# Patient Record
Sex: Female | Born: 1960 | Race: Asian | Hispanic: No | Smoking: Former smoker
Health system: Southern US, Community
[De-identification: ages and names within clinical notes are randomized; demographics above are authoritative.]

## PROBLEM LIST (undated history)

## (undated) DIAGNOSIS — K449 Diaphragmatic hernia without obstruction or gangrene: Secondary | ICD-10-CM

## (undated) DIAGNOSIS — U071 COVID-19: Secondary | ICD-10-CM

## (undated) DIAGNOSIS — D219 Benign neoplasm of connective and other soft tissue, unspecified: Secondary | ICD-10-CM

## (undated) DIAGNOSIS — S82899A Other fracture of unspecified lower leg, initial encounter for closed fracture: Secondary | ICD-10-CM

## (undated) DIAGNOSIS — S3992XA Unspecified injury of lower back, initial encounter: Secondary | ICD-10-CM

## (undated) HISTORY — DX: Diaphragmatic hernia without obstruction or gangrene: K44.9

## (undated) HISTORY — DX: Benign neoplasm of connective and other soft tissue, unspecified: D21.9

## (undated) HISTORY — PX: VAGINAL HYSTERECTOMY: SUR661

## (undated) HISTORY — DX: Other fracture of unspecified lower leg, initial encounter for closed fracture: S82.899A

## (undated) HISTORY — DX: Unspecified injury of lower back, initial encounter: S39.92XA

## (undated) HISTORY — DX: COVID-19: U07.1

---

## 1998-08-08 ENCOUNTER — Emergency Department (HOSPITAL_COMMUNITY): Admission: EM | Admit: 1998-08-08 | Discharge: 1998-08-08 | Payer: Self-pay | Admitting: Emergency Medicine

## 1999-08-25 ENCOUNTER — Emergency Department (HOSPITAL_COMMUNITY): Admission: EM | Admit: 1999-08-25 | Discharge: 1999-08-25 | Payer: Self-pay | Admitting: Emergency Medicine

## 2000-07-01 ENCOUNTER — Other Ambulatory Visit: Admission: RE | Admit: 2000-07-01 | Discharge: 2000-07-01 | Payer: Self-pay | Admitting: Gynecology

## 2002-07-02 ENCOUNTER — Emergency Department (HOSPITAL_COMMUNITY): Admission: EM | Admit: 2002-07-02 | Discharge: 2002-07-02 | Payer: Self-pay | Admitting: Emergency Medicine

## 2002-07-04 ENCOUNTER — Emergency Department (HOSPITAL_COMMUNITY): Admission: EM | Admit: 2002-07-04 | Discharge: 2002-07-04 | Payer: Self-pay | Admitting: Emergency Medicine

## 2002-10-08 ENCOUNTER — Other Ambulatory Visit: Admission: RE | Admit: 2002-10-08 | Discharge: 2002-10-08 | Payer: Self-pay | Admitting: Gynecology

## 2003-10-27 ENCOUNTER — Other Ambulatory Visit: Admission: RE | Admit: 2003-10-27 | Discharge: 2003-10-27 | Payer: Self-pay | Admitting: Gynecology

## 2004-05-04 ENCOUNTER — Encounter: Admission: RE | Admit: 2004-05-04 | Discharge: 2004-05-04 | Payer: Self-pay | Admitting: Neurology

## 2004-10-29 ENCOUNTER — Other Ambulatory Visit: Admission: RE | Admit: 2004-10-29 | Discharge: 2004-10-29 | Payer: Self-pay | Admitting: Gynecology

## 2005-10-30 ENCOUNTER — Other Ambulatory Visit: Admission: RE | Admit: 2005-10-30 | Discharge: 2005-10-30 | Payer: Self-pay | Admitting: Gynecology

## 2005-12-03 IMAGING — CR DG CERVICAL SPINE 2 OR 3 VIEWS
2 series · 2 of 2 positions shown · non-contrast
Comparison: none

CLINICAL DATA: Neck pain.  
 AP AND LATERAL CERVICAL SPINE: 
 There is slight reversal of the normal cervical lordosis at C4-5.  There is some minimal posterior spurring at C4-5 and C5-6 and on the AP view there is uncinate spurring at C5-6 on the left.  Intervertebral disc spaces are well preserved.  No facet joint arthritis.

[view not recorded (1 of 2)]
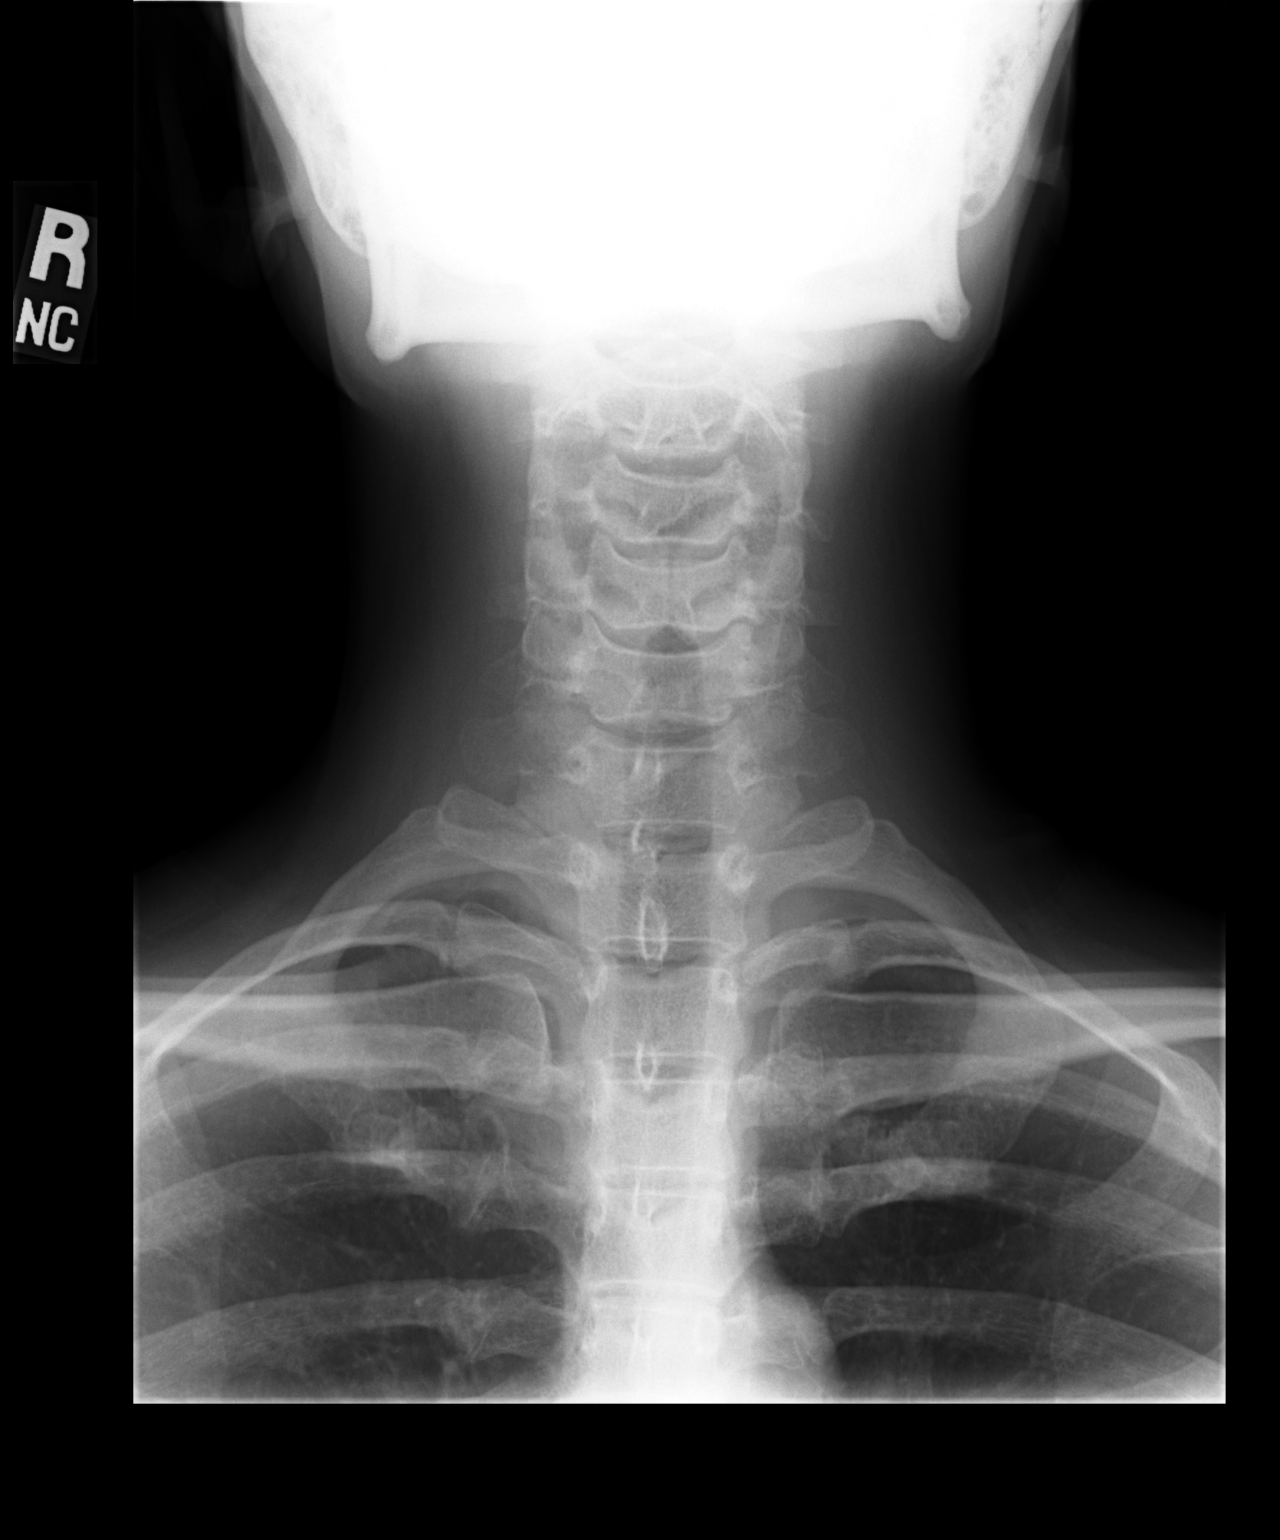

[view not recorded (2 of 2)]
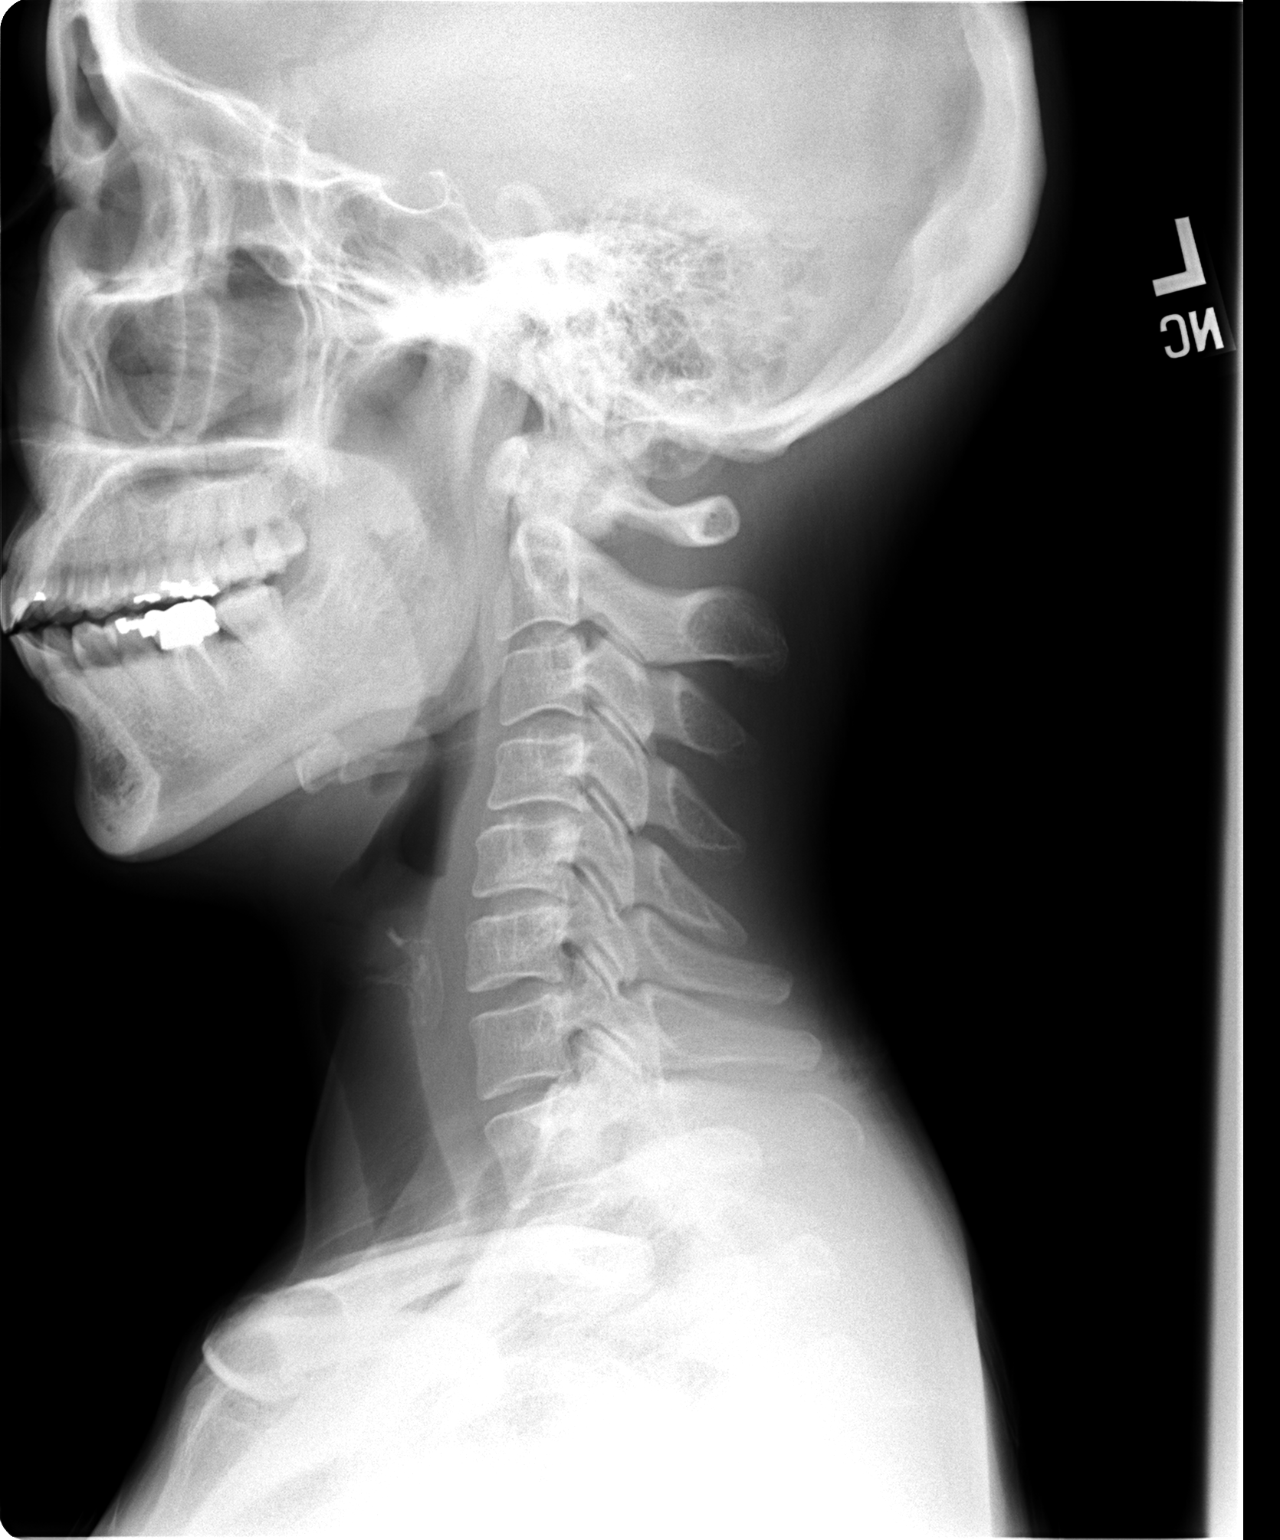

[2 of 2 positions shown; findings below may reference images not displayed]

IMPRESSION: Mild degenerative disc disease at C4-5 and C5-6 with mild uncinate spurring on the left at C5-6.

## 2006-10-31 ENCOUNTER — Other Ambulatory Visit: Admission: RE | Admit: 2006-10-31 | Discharge: 2006-10-31 | Payer: Self-pay | Admitting: Gynecology

## 2007-11-02 ENCOUNTER — Other Ambulatory Visit: Admission: RE | Admit: 2007-11-02 | Discharge: 2007-11-02 | Payer: Self-pay | Admitting: Gynecology

## 2008-11-04 ENCOUNTER — Other Ambulatory Visit: Admission: RE | Admit: 2008-11-04 | Discharge: 2008-11-04 | Payer: Self-pay | Admitting: Gynecology

## 2008-11-04 ENCOUNTER — Encounter: Payer: Self-pay | Admitting: Women's Health

## 2008-11-04 ENCOUNTER — Ambulatory Visit: Payer: Self-pay | Admitting: Women's Health

## 2009-07-21 ENCOUNTER — Ambulatory Visit: Payer: Self-pay | Admitting: Women's Health

## 2009-11-15 ENCOUNTER — Ambulatory Visit: Payer: Self-pay | Admitting: Women's Health

## 2009-11-15 ENCOUNTER — Other Ambulatory Visit: Admission: RE | Admit: 2009-11-15 | Discharge: 2009-11-15 | Payer: Self-pay | Admitting: Gynecology

## 2010-05-17 ENCOUNTER — Ambulatory Visit: Payer: Self-pay | Admitting: Women's Health

## 2010-07-19 ENCOUNTER — Ambulatory Visit: Payer: Self-pay | Admitting: Women's Health

## 2010-08-02 ENCOUNTER — Ambulatory Visit: Payer: Self-pay | Admitting: Gynecology

## 2010-08-03 ENCOUNTER — Ambulatory Visit: Payer: Self-pay | Admitting: Gynecology

## 2010-10-26 ENCOUNTER — Ambulatory Visit: Payer: Self-pay | Admitting: Gynecology

## 2010-12-05 ENCOUNTER — Ambulatory Visit: Payer: Self-pay | Admitting: Women's Health

## 2010-12-05 ENCOUNTER — Other Ambulatory Visit
Admission: RE | Admit: 2010-12-05 | Discharge: 2010-12-05 | Payer: Self-pay | Source: Home / Self Care | Admitting: Gynecology

## 2010-12-14 ENCOUNTER — Ambulatory Visit
Admission: RE | Admit: 2010-12-14 | Discharge: 2010-12-14 | Payer: Self-pay | Source: Home / Self Care | Attending: Gynecology | Admitting: Gynecology

## 2011-01-11 ENCOUNTER — Ambulatory Visit
Admission: RE | Admit: 2011-01-11 | Discharge: 2011-01-11 | Payer: Self-pay | Source: Home / Self Care | Attending: Gynecology | Admitting: Gynecology

## 2011-02-12 ENCOUNTER — Ambulatory Visit (INDEPENDENT_AMBULATORY_CARE_PROVIDER_SITE_OTHER): Payer: BC Managed Care – PPO | Admitting: Gynecology

## 2011-02-12 DIAGNOSIS — D259 Leiomyoma of uterus, unspecified: Secondary | ICD-10-CM

## 2011-02-12 DIAGNOSIS — N920 Excessive and frequent menstruation with regular cycle: Secondary | ICD-10-CM

## 2011-02-12 DIAGNOSIS — Z30431 Encounter for routine checking of intrauterine contraceptive device: Secondary | ICD-10-CM

## 2011-02-27 ENCOUNTER — Institutional Professional Consult (permissible substitution) (INDEPENDENT_AMBULATORY_CARE_PROVIDER_SITE_OTHER): Payer: BC Managed Care – PPO | Admitting: Gynecology

## 2011-02-27 DIAGNOSIS — D649 Anemia, unspecified: Secondary | ICD-10-CM

## 2011-02-27 DIAGNOSIS — N949 Unspecified condition associated with female genital organs and menstrual cycle: Secondary | ICD-10-CM

## 2011-02-27 DIAGNOSIS — D259 Leiomyoma of uterus, unspecified: Secondary | ICD-10-CM

## 2011-02-27 DIAGNOSIS — N925 Other specified irregular menstruation: Secondary | ICD-10-CM

## 2011-03-18 ENCOUNTER — Ambulatory Visit (INDEPENDENT_AMBULATORY_CARE_PROVIDER_SITE_OTHER): Payer: BC Managed Care – PPO

## 2011-03-18 DIAGNOSIS — D259 Leiomyoma of uterus, unspecified: Secondary | ICD-10-CM

## 2011-03-18 DIAGNOSIS — N920 Excessive and frequent menstruation with regular cycle: Secondary | ICD-10-CM

## 2011-04-24 ENCOUNTER — Ambulatory Visit (INDEPENDENT_AMBULATORY_CARE_PROVIDER_SITE_OTHER): Payer: BC Managed Care – PPO | Admitting: Gynecology

## 2011-04-24 ENCOUNTER — Other Ambulatory Visit: Payer: BC Managed Care – PPO

## 2011-04-24 ENCOUNTER — Ambulatory Visit: Payer: BC Managed Care – PPO | Admitting: Gynecology

## 2011-04-24 DIAGNOSIS — Z01818 Encounter for other preprocedural examination: Secondary | ICD-10-CM

## 2011-04-24 DIAGNOSIS — D259 Leiomyoma of uterus, unspecified: Secondary | ICD-10-CM

## 2011-04-24 DIAGNOSIS — N920 Excessive and frequent menstruation with regular cycle: Secondary | ICD-10-CM

## 2011-04-24 DIAGNOSIS — N83209 Unspecified ovarian cyst, unspecified side: Secondary | ICD-10-CM

## 2011-05-02 ENCOUNTER — Other Ambulatory Visit: Payer: Self-pay | Admitting: Gynecology

## 2011-05-02 ENCOUNTER — Ambulatory Visit (HOSPITAL_BASED_OUTPATIENT_CLINIC_OR_DEPARTMENT_OTHER)
Admission: RE | Admit: 2011-05-02 | Discharge: 2011-05-03 | Disposition: A | Discharge status: Home / Self Care | Payer: BC Managed Care – PPO | Source: Ambulatory Visit | Attending: Gynecology | Admitting: Gynecology

## 2011-05-02 DIAGNOSIS — D259 Leiomyoma of uterus, unspecified: Secondary | ICD-10-CM

## 2011-05-02 DIAGNOSIS — Z79899 Other long term (current) drug therapy: Secondary | ICD-10-CM | POA: Insufficient documentation

## 2011-05-02 DIAGNOSIS — D500 Iron deficiency anemia secondary to blood loss (chronic): Secondary | ICD-10-CM | POA: Insufficient documentation

## 2011-05-02 DIAGNOSIS — D649 Anemia, unspecified: Secondary | ICD-10-CM

## 2011-05-02 DIAGNOSIS — N920 Excessive and frequent menstruation with regular cycle: Secondary | ICD-10-CM

## 2011-05-02 DIAGNOSIS — D250 Submucous leiomyoma of uterus: Secondary | ICD-10-CM

## 2011-05-02 DIAGNOSIS — K449 Diaphragmatic hernia without obstruction or gangrene: Secondary | ICD-10-CM | POA: Insufficient documentation

## 2011-05-02 DIAGNOSIS — K219 Gastro-esophageal reflux disease without esophagitis: Secondary | ICD-10-CM | POA: Insufficient documentation

## 2011-05-02 NOTE — H&P (Signed)
NAMEGITA, DILGER               ACCOUNT NO.:  [id number]  MEDICAL RECORD NO.:  [id number]          PATIENT TYPE:  LOCATION:                                 FACILITY:  PHYSICIAN:  Waymon Laser H. Lily Peer, M.D.DATE OF BIRTH:  May 21, 1961  DATE OF ADMISSION: DATE OF DISCHARGE:                             HISTORY & PHYSICAL   The patient is scheduled for surgery, Thursday, May 17 at 7:30 a.m. at [facility].  Please have history and physical available.  CHIEF COMPLAINT:  Symptomatic leiomyomatous uteri contributing to iron- deficiency anemia and dysfunctional uterine bleeding.  HISTORY OF PRESENT ILLNESS:  This 50 year old gravida 0 who was scheduled to undergo total laparoscopic hysterectomy secondary to leiomyomatous uteri, menorrhagia, and anemia scheduled for May 02, 2011. The patient has had a history of Mirena IUD which twice has fallen out as a result of an encroaching fibroid.  She also had a benign endometrial biopsy within the past year and had a normal Pap smear in December 2011 which was normal as well.  She had been placed on Megace in an effort to stop her bleeding and then put her on supplemental iron because her iron levels have dropped as low as 8.1 in April 2012.  She was then placed on Lupron 11.25 mg IM in preparation for her surgery. Hemoglobin/hematocrit on her preop visit on May 9 demonstrated hemoglobin of 12.1, hematocrit 36.5 with a platelet count of 304,000. Ultrasound demonstrated a uterus that measured 9.7 x 7.4 x 4.9 cm with approximately 7 fibroids, the largest one measuring approximately 3.3 cm and there was a little bit of fluid in the endometrial cavity.  There was a submucous myoma encroaching to the endometrial cavity which measured 3.3 cm.  The left ovary had a thin, flat cyst measuring 9.3 mm. Previous cyst of the left ovary not seen.  Her right ovary was otherwise normal.  ALLERGIES:  She denies any allergies.  OBSTETRIC AND  GYNECOLOGICAL HISTORY: 1. Nulliparous. 2. Leiomyomatous uteri.  PAST MEDICAL HISTORY: 1. Hiatal hernia. 2. Iron-deficiency anemia.  MEDICATIONS: 1. Previously had been on Megace 40 mg b.i.d. 2. Subsequently placed on Lupron 11.25 mg IM. 3. Iron supplementation.  SURGERIES AND HOSPITALIZATIONS:  None.  FAMILY HISTORY:  Grandmother and grandfather with history of diabetes.  REVIEW OF SYSTEMS:  Positive only for the GU system whereby had leiomyomatous uteri, iron-deficiency anemia, menorrhagia and pelvic pressure.  SOCIAL HISTORY:  The patient is a professor at [employer].  PHYSICAL EXAMINATION:  VITAL SIGNS:  The patient weighs 110 pounds.  She is 5 feet 1-1/2 inches tall, BMI 21.  Blood pressure 120/70. HEENT:  Unremarkable. NECK:  Supple.  Trachea midline.  No carotid bruits or thyromegaly. LUNGS:  Clear to auscultation without rhonchi or wheezes. HEART:  Regular rate and rhythm.  No murmurs or gallops. BREASTS:  Unremarkable. ABDOMEN:  Soft, nontender.  No rebound or guarding. PELVIC:  Bartholin, urethra, Skene's within normal limits.  Vagina was unremarkable.  Uterus of approximately 8 to 10-week size, irregular shaped, no palpable adnexal masses.  ASSESSMENT:  A 50 year old nulliparous patient with leiomyomatous uteri, iron-deficiency anemia and dysfunctional  uterine bleeding, scheduled to undergo a total laparoscopic hysterectomy on the morning of Thursday, May 17 at 7:30 a.m. in [facility].  The patient previously had been provided with literature information outlined in the operation as well as a preoperative discussion about the type of potential risk of the operation to include the following.  Due to the fact that she is 50 years of age, she will have PAS stockings in an effort to prevent DVT and subsequent pulmonary embolism.  Also in effort to prevent infection, she will receive prophylaxis antibiotics IV as well.  If for some reason they were not able  to accomplish the operation laparoscopically, we may need to do an open laparotomy technique to complete the operation which would incur for the patient to stay in the hospital extra few days.  Also, there is a risk of trauma and injury to internal organs such as the bladder, intestines and nearby structures, requiring remote emergency laparotomy and also in the event of a hemorrhage and the patient would need blood or blood products.  She is fully aware of the potential risks from donor blood to recipient blood to include anaphylactic reaction, hepatitis and AIDS.  Since the patient is 50 years of age, she does not want to have her ovaries removed, so they will be left in place.  All these issues were discussed with the patient in detail.  All questions were answered.  We will follow accordingly.  PLAN:  The patient is scheduled for total laparoscopic hysterectomy, Thursday May 17 at 7:30 a.m. at [facility].  Please have history and physical available.     Sheridan Hew H. Lily Peer, M.D.     JHF/MEDQ  D:  05/01/2011  T:  05/01/2011  Job:  [id number]  Electronically Signed by Reynaldo Minium M.D. on 05/02/2011 10:45:45 AM

## 2011-05-03 LAB — CBC
HCT: 33.2 % — ABNORMAL LOW (ref 36.0–46.0)
Hemoglobin: 10.8 g/dL — ABNORMAL LOW (ref 12.0–15.0)
MCHC: 32.5 g/dL (ref 30.0–36.0)
MCV: 93 fL (ref 78.0–100.0)

## 2011-05-07 ENCOUNTER — Ambulatory Visit: Payer: BC Managed Care – PPO | Admitting: Gynecology

## 2011-05-07 ENCOUNTER — Other Ambulatory Visit: Payer: BC Managed Care – PPO

## 2011-05-09 NOTE — Op Note (Signed)
NAMESOSIE, GATO               ACCOUNT NO.:  [id number]  MEDICAL RECORD NO.:  [id number]            PATIENT TYPE:  LOCATION:                                 FACILITY:  PHYSICIAN:  Juan H. Lily Peer, M.D.     DATE OF BIRTH:  DATE OF PROCEDURE:  05/02/2011 DATE OF DISCHARGE:                              OPERATIVE REPORT   SURGEON:  Juan H. Lily Peer, M.D.  FIRST ASSISTANT:  Timothy P. Fontaine, M.D.  INDICATIONS FOR OPERATION:  A 50 year old gravida 0 with symptomatic leiomyomatous uteri contributing to menorrhagia and anemia.  PREOPERATIVE DIAGNOSES: 1. Leiomyomatous uteri. 2. Menorrhagia. 3. Iron-deficiency anemia.  POSTOPERATIVE DIAGNOSES: 1. Leiomyomatous uteri. 2. Menorrhagia. 3. Iron-deficiency anemia.  ANESTHESIA:  General endotracheal anesthesia.  PROCEDURE PERFORMED:  Total laparoscopic hysterectomy.  FINDINGS:  The patient had normal-appearing tubes and ovaries, had multilobulated uterus with a prominent subserosal leiomyoma approximately 3 x 4 cm in the posterior lower uterine segment of the uterus.  There were no adhesions or endometriotic implants on the anterior-posterior cul-de-sac.  DESCRIPTION OF OPERATION:  After the patient was adequately counseled, she was taken to the operating room where she underwent successful general endotracheal anesthesia.  For DVT prophylaxis, she had PSA stockings and to prevent infection she had received 1 g of Ceftin IV. Bimanual examination had demonstrated anteverted uterus and irregular shape.  The abdomen, vagina, and perineum had been prepped and draped in usual sterile fashion.  A Foley catheter had been inserted.  The cervix was grasped with a single-tooth tenaculum.  The uterus sounded to approximately 8.5 cm.  A RUMI uterine manipulator tip 1 cm left in the uterus sound was applied to the RUMI uterine manipulator followed by placement of the pneumo-occluder.  The appropriate KOH cup was applied after  measuring the cervical circumference and it was inserted into the cervical canal and a 5 cc balloon tip was inflated and the single-tooth tenaculum was removed and KOH cup was in the right position surrounding the cervix and around the fornices.  After the drapes were then placed, a small semilunar incision was made underneath the umbilicus followed by utilizing a 10 Optiview trocar into the peritoneal cavity. Pneumoperitoneum was established with approximately 3 L of carbon dioxide.  The patient was then placed in Trendelenburg position.  Two additional port sites were made on the right and left of the patient's lower abdomen under laparoscopic guidance with the 5-mm trocar.  The systematic inspection of the pelvic cavity demonstrated normal-appearing tubes and ovary and leiomyomatous uteri and subserosal leiomyoma was prominent in the posterior lower uterine segment of the uterus.  A Harmonic scalpel was utilized during the procedure.  Attention was placed first to the right utero-ovarian ligament and right fallopian tube which were coaptated and transected with Harmonic scalpel leaving the right tube and ovary.  The remainder of the broad and cardinal ligaments were serially coaptated and transected with the Harmonic scalpel.  A bladder flap was then established.  A similar procedure was carried out on the contralateral side leaving the left ovary behind as well.  Both right and left uterine arteries had  been identified and with the bipolar Kleppinger forceps were cauterized and then the Harmonic scalpel was utilized to coaptate and transect both the right and left uterine arteries.  After the bladder had been peeled from the lower uterine segment, identification of where the KOH cup was putting pressure on the fornices.  In a circumferential manner, the back blade of the Harmonic scalpel was utilized to score and complete the colpotomy in a circumferential fashion and the cervix and  uterus were then passed off the operative field and a bulb syringe was left in the vagina to maintain pneumoperitoneum.  The pelvic cavity was then copiously irrigated with normal saline solution and the vaginal cuff was closed with a laparoscopic Endo Stitch of 0 Vicryl suture with figure-of-8. The pelvic cavity was then copiously irrigated with normal saline solution.  Sponge count and needle count were correct.  The pneumoperitoneum was released after ascertaining there was no bleeding. The cuff and pedicles were dry.  The instruments were removed.  The subumbilical fascia was closed with a figure-of-eight 0 Vicryl suture as was right 10-mm port fascia with a figure-of-eight of 0 Vicryl suture. All 3 port site skins were closed with Dermabond glue and 8 cc of 0.25% Marcaine was infiltrated in all 3 port sites for postoperative analgesia.  The bulb was then removed from the vagina and the patient was extubated, transferred to recovery room with stable vital signs. Her blood loss from the procedure was recorded as less than 100 cc.  IV fluids were 1200 cc of lactated Ringer's.  Urine output was 200 cc and clear.  She was to receive 30 mg of Toradol en route to the recovery room.     Juan H. Lily Peer, M.D.     JHF/MEDQ  D:  05/02/2011  T:  05/02/2011  Job:  [id number]  Electronically Signed by Reynaldo Minium M.D. on 05/09/2011 03:09:06 PM

## 2011-05-29 ENCOUNTER — Ambulatory Visit (INDEPENDENT_AMBULATORY_CARE_PROVIDER_SITE_OTHER): Payer: BC Managed Care – PPO | Admitting: Gynecology

## 2011-05-29 DIAGNOSIS — Z9889 Other specified postprocedural states: Secondary | ICD-10-CM

## 2011-06-04 NOTE — Discharge Summary (Signed)
  Bethany Watts, Bethany Watts               ACCOUNT NO.:  [id number]  MEDICAL RECORD NO.:  [id number]            PATIENT TYPE:  LOCATION:                                 FACILITY:  PHYSICIAN:  Juan H. Lily Peer, M.D.DATE OF BIRTH:  August 09, 1961  DATE OF ADMISSION:  05/02/2011 DATE OF DISCHARGE:  05/03/2011                              DISCHARGE SUMMARY   TOTAL DAYS HOSPITALIZED:  One.  HISTORY:  The patient is a 50 year old gravida 0 with symptomatic leiomyomatous uteri contributing to menorrhagia and anemia.  The patient on the morning of May 02, 2011, underwent a total laparoscopic hysterectomy and did well intraoperatively.  Her blood loss was less than 100 cc.  Her IV fluids had consisted of 1200 cc of lactated Ringer's.  She had received cefotetan 1 g IV for prophylaxis and had PSA stockings for DVT prophylaxis intraoperatively as well as postoperatively.  She did well postoperatively.  She remained afebrile throughout the night with a temperature max of 98.2.  She was normotensive with her blood pressure 113/62.  Her respiration was 18. Her pulse was 59 to 71 range, 98% O2 sats on room air.  She had been gradually advanced from a clear liquid to regular diet and tolerated it well.  She was up and ambulating.  She had not passed flatus, used minimal narcotic for pain relief and her hemoglobin and hematocrit was 10.8 and 33.2 respectively with a platelet count of 201,000, and she was ready to be discharged home.  POSTOPERATIVE DIAGNOSES: 1. Leiomyomatous uteri. 2. Menorrhagia. 3. Anemia.  PROCEDURE PERFORMED:  Total laparoscopic hysterectomy.  FINAL DISPOSITION/FOLLOWUP:  The patient was discharged home on her second postop day.  She was up, ambulating, tolerating regular diet well and voiding spontaneously.  She will return to the office in 3 weeks for postop visit.  She has been given a prescription of Lortab to take 5/500 take one p.o. q.4-6 h. p.r.n. and Reglan 10 mg 1 p.o.  q.4-6 h. p.r.n. Of note, her port site was slightly erythematous and she was given a sample of Neosporin Cream to apply b.i.d.  If the redness increases or she develops any fever or any questions, to report to the office before her scheduled 3-week postop visit.     Juan H. Lily Peer, M.D.     JHF/MEDQ  D:  05/03/2011  T:  05/03/2011  Job:  [id number]  Electronically Signed by Reynaldo Minium M.D. on 06/04/2011 04:54:09 PM

## 2011-06-12 ENCOUNTER — Ambulatory Visit (INDEPENDENT_AMBULATORY_CARE_PROVIDER_SITE_OTHER): Payer: BC Managed Care – PPO | Admitting: Gynecology

## 2011-06-12 DIAGNOSIS — Z9889 Other specified postprocedural states: Secondary | ICD-10-CM

## 2011-09-13 ENCOUNTER — Telehealth: Payer: Self-pay | Admitting: Women's Health

## 2011-09-18 NOTE — Telephone Encounter (Signed)
TC for followup states doing much better, on no ERT.

## 2011-10-17 DIAGNOSIS — S82899A Other fracture of unspecified lower leg, initial encounter for closed fracture: Secondary | ICD-10-CM

## 2011-10-17 HISTORY — DX: Other fracture of unspecified lower leg, initial encounter for closed fracture: S82.899A

## 2011-12-02 ENCOUNTER — Encounter: Payer: Self-pay | Admitting: Women's Health

## 2011-12-11 ENCOUNTER — Ambulatory Visit (INDEPENDENT_AMBULATORY_CARE_PROVIDER_SITE_OTHER): Payer: BC Managed Care – PPO | Admitting: Women's Health

## 2011-12-11 ENCOUNTER — Encounter: Payer: Self-pay | Admitting: Women's Health

## 2011-12-11 VITALS — BP 138/80 | Ht 60.5 in | Wt 112.0 lb

## 2011-12-11 DIAGNOSIS — Z833 Family history of diabetes mellitus: Secondary | ICD-10-CM

## 2011-12-11 DIAGNOSIS — Z13220 Encounter for screening for lipoid disorders: Secondary | ICD-10-CM

## 2011-12-11 DIAGNOSIS — Z01419 Encounter for gynecological examination (general) (routine) without abnormal findings: Secondary | ICD-10-CM

## 2011-12-11 DIAGNOSIS — R82998 Other abnormal findings in urine: Secondary | ICD-10-CM

## 2011-12-11 LAB — GLUCOSE, RANDOM: Glucose, Bld: 87 mg/dL (ref 70–99)

## 2011-12-11 LAB — LIPID PANEL
LDL Cholesterol: 96 mg/dL (ref 0–99)
VLDL: 23 mg/dL (ref 0–40)

## 2011-12-11 NOTE — Progress Notes (Signed)
Bethany Watts [birth year] [id number]    History:    The patient presents for annual exam.  [place] 5/12 for fibroids. Occasional menopausal symptoms. History of normal Paps and mammograms. States has had some stiffness and achiness in shoulders wrists and hands, worse in the a.m.   Past medical history, past surgical history, family history and social history were all reviewed and documented in the EPIC chart.   ROS:  A  ROS was performed and pertinent positives and negatives are included in the history.  Exam:  Filed Vitals:   12/11/11 1108  BP: 138/80    General appearance:  Normal Head/Neck:  Normal, without cervical or supraclavicular adenopathy. Thyroid:  Symmetrical, normal in size, without palpable masses or nodularity. Respiratory  Effort:  Normal  Auscultation:  Clear without wheezing or rhonchi Cardiovascular  Auscultation:  Regular rate, without rubs, murmurs or gallops  Edema/varicosities:  Not grossly evident Abdominal  Soft,nontender, without masses, guarding or rebound.  Liver/spleen:  No organomegaly noted  Hernia:  None appreciated  Skin  Inspection:  Grossly normal  Palpation:  Grossly normal Neurologic/psychiatric  Orientation:  Normal with appropriate conversation.  Mood/affect:  Normal  Genitourinary    Breasts: Examined lying and sitting.     Right: Without masses, retractions, discharge or axillary adenopathy.     Left: Without masses, retractions, discharge or axillary adenopathy.   Inguinal/mons:  Normal without inguinal adenopathy  External genitalia:  Normal  BUS/Urethra/Skene's glands:  Normal  Bladder:  Normal  Vagina:  Normal  Cervix:  Absent   Uterus:  Absent  Adnexa/parametria:     Rt: Without masses or tenderness.   Lt: Without masses or tenderness.  Anus and perineum: Normal  Digital rectal exam: Normal sphincter tone without palpated masses or tenderness  Assessment/Plan: 50 y.o. SAF G0 for annual exam.   Normal GYN  exam/LAH-fibroids  Plan: CBC, glucose, lipid profile, UA. SBEs, annual mammogram, exercise, calcium rich diet vitamin D 2000 daily encouraged. Followup with primary care for problem with hand and wrist stiffness. Menopausal symptoms discussed, declines need for any indications.     Harrington Challenger [facility], 1:49 PM 12/11/2011

## 2012-09-23 ENCOUNTER — Ambulatory Visit (INDEPENDENT_AMBULATORY_CARE_PROVIDER_SITE_OTHER): Payer: BC Managed Care – PPO | Admitting: Licensed Clinical Social Worker

## 2012-09-23 DIAGNOSIS — IMO0002 Reserved for concepts with insufficient information to code with codable children: Secondary | ICD-10-CM

## 2012-10-21 ENCOUNTER — Ambulatory Visit: Payer: BC Managed Care – PPO | Admitting: Licensed Clinical Social Worker

## 2012-11-02 ENCOUNTER — Ambulatory Visit (INDEPENDENT_AMBULATORY_CARE_PROVIDER_SITE_OTHER): Payer: BC Managed Care – PPO | Admitting: Licensed Clinical Social Worker

## 2012-11-02 DIAGNOSIS — IMO0002 Reserved for concepts with insufficient information to code with codable children: Secondary | ICD-10-CM

## 2012-11-30 ENCOUNTER — Ambulatory Visit (INDEPENDENT_AMBULATORY_CARE_PROVIDER_SITE_OTHER): Payer: BC Managed Care – PPO | Admitting: Licensed Clinical Social Worker

## 2012-11-30 DIAGNOSIS — IMO0002 Reserved for concepts with insufficient information to code with codable children: Secondary | ICD-10-CM

## 2012-12-14 ENCOUNTER — Encounter: Payer: BC Managed Care – PPO | Admitting: Women's Health

## 2012-12-21 ENCOUNTER — Ambulatory Visit (INDEPENDENT_AMBULATORY_CARE_PROVIDER_SITE_OTHER): Payer: BC Managed Care – PPO | Admitting: Licensed Clinical Social Worker

## 2012-12-21 DIAGNOSIS — IMO0002 Reserved for concepts with insufficient information to code with codable children: Secondary | ICD-10-CM

## 2012-12-22 ENCOUNTER — Encounter: Payer: Self-pay | Admitting: Gynecology

## 2012-12-24 ENCOUNTER — Encounter: Payer: Self-pay | Admitting: Gynecology

## 2012-12-24 ENCOUNTER — Encounter: Payer: Self-pay | Admitting: Women's Health

## 2012-12-24 ENCOUNTER — Ambulatory Visit (INDEPENDENT_AMBULATORY_CARE_PROVIDER_SITE_OTHER): Payer: BC Managed Care – PPO | Admitting: Women's Health

## 2012-12-24 VITALS — BP 120/78 | Ht 61.0 in | Wt 101.0 lb

## 2012-12-24 DIAGNOSIS — Z780 Asymptomatic menopausal state: Secondary | ICD-10-CM

## 2012-12-24 DIAGNOSIS — S3992XA Unspecified injury of lower back, initial encounter: Secondary | ICD-10-CM | POA: Insufficient documentation

## 2012-12-24 DIAGNOSIS — Z01419 Encounter for gynecological examination (general) (routine) without abnormal findings: Secondary | ICD-10-CM

## 2012-12-24 DIAGNOSIS — K449 Diaphragmatic hernia without obstruction or gangrene: Secondary | ICD-10-CM | POA: Insufficient documentation

## 2012-12-24 DIAGNOSIS — D219 Benign neoplasm of connective and other soft tissue, unspecified: Secondary | ICD-10-CM | POA: Insufficient documentation

## 2012-12-24 DIAGNOSIS — D649 Anemia, unspecified: Secondary | ICD-10-CM | POA: Insufficient documentation

## 2012-12-24 DIAGNOSIS — S82899A Other fracture of unspecified lower leg, initial encounter for closed fracture: Secondary | ICD-10-CM | POA: Insufficient documentation

## 2012-12-24 DIAGNOSIS — D259 Leiomyoma of uterus, unspecified: Secondary | ICD-10-CM

## 2012-12-24 LAB — CBC WITH DIFFERENTIAL/PLATELET
Basophils Relative: 1 % (ref 0–1)
Eosinophils Absolute: 0.1 10*3/uL (ref 0.0–0.7)
Eosinophils Relative: 2 % (ref 0–5)
Lymphocytes Relative: 27 % (ref 12–46)
Lymphs Abs: 1.2 10*3/uL (ref 0.7–4.0)
MCH: 31.4 pg (ref 26.0–34.0)
MCV: 92 fL (ref 78.0–100.0)
RBC: 4.39 MIL/uL (ref 3.87–5.11)
WBC: 4.5 10*3/uL (ref 4.0–10.5)

## 2012-12-24 MED ORDER — ESTRADIOL 0.5 MG PO TABS: 0.5000 mg | ORAL_TABLET | Freq: Every day | ORAL | Status: DC

## 2012-12-24 NOTE — Progress Notes (Signed)
Bethany Watts [birth year] [id number]    History:    The patient presents for annual exam.  Having numerous hot flushes without good relief from block cohosh. History of normal Paps and mammograms. LAVH 2012 fibroids and menorrhagia. Fractured right ankle from fall 2012.   Past medical history, past surgical history, family history and social history were all reviewed and documented in the EPIC chart. 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   ROS:  A  ROS was performed and pertinent positives and negatives are included in the history.  Exam:  Filed Vitals:   12/24/12 1155  BP: 120/78    General appearance:  Normal Head/Neck:  Normal, without cervical or supraclavicular adenopathy. Thyroid:  Symmetrical, normal in size, without palpable masses or nodularity. Respiratory  Effort:  Normal  Auscultation:  Clear without wheezing or rhonchi Cardiovascular  Auscultation:  Regular rate, without rubs, murmurs or gallops  Edema/varicosities:  Not grossly evident Abdominal  Soft,nontender, without masses, guarding or rebound.  Liver/spleen:  No organomegaly noted  Hernia:  None appreciated  Skin  Inspection:  Grossly normal  Palpation:  Grossly normal Neurologic/psychiatric  Orientation:  Normal with appropriate conversation.  Mood/affect:  Normal  Genitourinary    Breasts: Examined lying and sitting.     Right: Without masses, retractions, discharge or axillary adenopathy.     Left: Without masses, retractions, discharge or axillary adenopathy.   Inguinal/mons:  Normal without inguinal adenopathy  External genitalia:  Normal  BUS/Urethra/Skene's glands:  Normal  Bladder:  Normal  Vagina:  Normal  Cervix:  Absent  Uterus:  Absent  Adnexa/parametria:     Rt: Without masses or tenderness.   Lt: Without masses or tenderness.  Anus and perineum: Normal  Digital rectal exam: Normal sphincter tone without palpated masses or tenderness  Assessment/Plan:  52 y.o. [name] G0 for  annual exam.     Menopausal symptoms LAVH  Plan: SBE's, continue annual mammogram, continue exercise, yoga, reviewed importance of maintaining or gaining weight  (weight 101), vitamin D 2000 daily encouraged. CBC, UA, Pap normal 2012, new screening guidelines reviewed. Menopause reviewed, options reviewed, will try Estradiol 0.5 mg daily, instructed to call if no relief of symptoms. Reviewed slight risk for blood clots, strokes, breast cancer and accepts. DEXA, will schedule here. Reviewed importance of screening colonoscopy, instructed to schedule this year.   Harrington Challenger WHNP, 1:02 PM 12/24/2012

## 2012-12-24 NOTE — Patient Instructions (Signed)
Dr Loreta Ave  Colonoscopy Vit D 2000 daily Keep doing yoga  Health Recommendations for Postmenopausal Women Based on the Results of the Women's Health Initiative [facility]) and Other Studies The WHI is a major 15-year research program to address the most common causes of death, disability and poor quality of life in postmenopausal women. Some of these causes are heart disease, cancer, bone loss (osteoporosis) and others. Taking into account all of the findings from [facility] and other studies, here are bottom-line health recommendations for women: CARDIOVASCULAR DISEASE Heart Disease: A heart attack is a medical emergency. Know the signs and symptoms of a heart attack. Hormone therapy should not be used to prevent heart disease. In women with heart disease, hormone therapy should not be used to prevent further disease. Hormone therapy increases the risk of blood clots. Below are things women can do to reduce their risk for heart disease.   Do not smoke. If you smoke, quit. Women who smoke are 2 to 6 times more likely to suffer a heart attack than non-smoking women.  Aim for a healthy weight. Being overweight causes many preventable deaths. Eat a healthy and balanced diet and drink an adequate amount of liquids.  Get moving. Make a commitment to be more physically active. Aim for 30 minutes of activity on most, if not all days of the week.  Eat for heart health. Choose a diet that is low in saturated fat, trans fat, and cholesterol. Include whole grains, vegetables, and fruits. Read the labels on the food container before buying it.  Know your numbers. Ask your caregiver to check your blood pressure, cholesterol (total, HDL, LDL, triglycerides) and blood glucose. Work with your caregiver to improve any numbers that are not normal.  High blood pressure. Limit or stop your table salt intake (try salt substitute and food seasonings), avoid salty foods and drinks. Read the labels on the food container before buying  it. Avoid becoming overweight by eating well and exercising. STROKE  Stroke is a medical emergency. Stroke can be the result of a blood clot in the blood vessel in the brain or by a brain hemorrhage (bleeding). Know the signs and symptoms of a stroke. To lower the risk of developing a stroke:  Avoid fatty foods.  Quit smoking.  Control your diabetes, blood pressure, and irregular heart rate. THROMBOPHLIBITIS (BLOOD CLOT) OF THE LEG  Hormone treatment is a big cause of developing blood clots in the leg. Becoming overweight and leading a stationary lifestyle also may contribute to developing blood clots. Controlling your diet and exercising will help lower the risk of developing blood clots. CANCER SCREENING  Breast Cancer: Women should take steps to reduce their risk of breast cancer. This includes having regular mammograms, monthly self breast exams and regular breast exams by your caregiver. Have a mammogram every one to two years if you are 32 to 51 years old. Have a mammogram annually if you are 83 years old or older depending on your risk factors. Women who are high risk for breast cancer may need more frequent mammograms. There are tests available (testing the genes in your body) if you have family history of breast cancer called BRCA 1 and 2. These tests can help determine the risks of developing breast cancer.  Intestinal or Stomach Cancer: Women should talk to their caregiver about when to start screening, what tests and how often they should be done, and the benefits and risks of doing these tests. Tests to consider are a rectal  exam, fecal occult blood, sigmoidoscopy, colononoscoby, barium enema and upper GI series of the stomach. Depending on the age, you may want to get a medical and family history of colon cancer. Women who are high risk may need to be screened at an earlier age and more often.  Cervical Cancer: A Pap test of the cervix should be done every year and every 3 years when  there has been three straight years of a normal Pap test. Women with an abnormal Pap test should be screened more often or have a cervical biopsy depending on your caregiver's recommendation.  Uterine Cancer: If you have vaginal bleeding after you are in the menopause, it should be evaluated by your caregiver.  Ovarian cancer: There are no reliable tests available to screen for ovarian cancer at this time except for yearly pelvic exams.  Lung Cancer: Yearly chest X-rays can detect lung cancer and should be done on high risk women, such as cigarette smokers and women with chronic lung disease (emphysemia).  Skin Cancer: A complete body skin exam should be done at your yearly examination. Avoid overexposure to the sun and ultraviolet light lamps. Use a strong sun block cream when in the sun. All of these things are important in lowering the risk of skin cancer. MENOPAUSE Menopause Symptoms: Hormone therapy products are effective for treating symptoms associated with menopause:  Moderate to severe hot flashes.  Night sweats.  Mood swings.  Headaches.  Tiredness.  Loss of sex drive.  Insomnia.  Other symptoms. However, hormone therapy products carry serious risks, especially in older women. Women who use or are thinking about using estrogen or estrogen with progestin treatments should discuss that with their caregiver. Your caregiver will know if the benefits outweigh the risks. The Food and Drug Administration (FDA) has concluded that hormone therapy should be used only at the lowest doses and for the shortest amount of time to reach treatment goals. It is not known at what doses there may be less risk of serious side effects. There are other treatments such as herbal medication (not controlled or regulated by the FDA), group therapy, counseling and acupuncture that may be helpful. OSTEOPOROSIS Protecting Against Bone Loss and Preventing Fracture: If hormone therapy is used for prevention  of bone loss (osteoporosis), the risks for bone loss must outweigh the risk of the therapy. Women considering taking hormone therapy for bone loss should ask their health care providers about other medications (fosamax and boniva) that are considered safe and effective for preventing bone loss and bone fractures. To guard against bone loss or fractures, it is recommended that women should take at least 1000-1500 mg of calcium and 400-800 IU of vitamin D daily in divided doses. Smoking and excessive alcohol intake increases the risk of osteoporosis. Eat foods rich in calcium and vitamin D and do weight bearing exercises several times a week as your caregiver suggests. DIABETES Diabetes Melitus: Women with Type I or Type 2 diabetes should keep their diabetes in control with diet, exercise and medication. Avoid too many sweets, starchy and fatty foods. Being overweight can affect your diabetes. COGNITION AND MEMORY Cognition and Memory: Menopausal hormone therapy is not recommended for the prevention of cognitive disorders such as Alzheimer's disease or memory loss. WHI found that women treated with hormone therapy have a greater risk of developing dementia.  DEPRESSION  Depression may occur at any age, but is common in elderly women. The reasons may be because of physical, medical, social (loneliness), financial and/or  economic problems and needs. Becoming involved with church, volunteer or social groups, seeking treatment for any physical or medical problems is recommended. Also, look into getting professional advice for any economic or financial problems. ACCIDENTS  Accidents are common and can be serious in the elderly woman. Prepare your house to prevent accidents. Eliminate throw rugs, use hip protectors, place hand bars in the bath, shower and toilet areas. Avoid wearing high heel shoes and walking on wet, snowy and icy areas. Stop driving if you have vision, hearing problems or are unsteady with you  movements and reflexes. RHEUMATOID ARTHRITIS Rheumatoid arthritis causes pain, swelling and stiffness of your bone joints. It can limit many of your activities. Over-the-counter medications may help, but prescription medications may be necessary. Talk with your caregiver about this. Exercise (walking, water aerobics), good posture, using splints on painful joints, warm baths or applying warm compresses to stiff joints and cold compresses to painful joints may be helpful. Smoking and excessive drinking may worsen the symptoms of arthritis. Seek help from a physical therapist if the arthritis is becoming a problem with your daily activities. IMMUNIZATIONS  Several immunizations are important to have during your senior years, including:   Tetanus and a diptheria shot booster every 10 years.  Influenza every year before the flu season begins.  Pneumonia vaccine.  Shingles vaccine.  Others as indicated (example: H1N1 vaccine). Document Released: 01/24/2006 Document Revised: 02/24/2012 Document Reviewed: 09/19/2008 [facility] Patient Information 2013 [place], [place].

## 2012-12-24 NOTE — Assessment & Plan Note (Addendum)
LAVH  04/2011

## 2012-12-25 LAB — URINALYSIS W MICROSCOPIC + REFLEX CULTURE
Casts: NONE SEEN
Crystals: NONE SEEN
Ketones, ur: NEGATIVE mg/dL
Leukocytes, UA: NEGATIVE
Specific Gravity, Urine: 1.009 (ref 1.005–1.030)

## 2013-01-18 ENCOUNTER — Ambulatory Visit (INDEPENDENT_AMBULATORY_CARE_PROVIDER_SITE_OTHER): Payer: BC Managed Care – PPO | Admitting: Licensed Clinical Social Worker

## 2013-01-18 DIAGNOSIS — IMO0002 Reserved for concepts with insufficient information to code with codable children: Secondary | ICD-10-CM

## 2013-02-15 ENCOUNTER — Other Ambulatory Visit: Payer: Self-pay | Admitting: Gynecology

## 2013-02-15 DIAGNOSIS — Z13820 Encounter for screening for osteoporosis: Secondary | ICD-10-CM

## 2013-02-23 ENCOUNTER — Ambulatory Visit (INDEPENDENT_AMBULATORY_CARE_PROVIDER_SITE_OTHER): Payer: BC Managed Care – PPO

## 2013-02-23 DIAGNOSIS — Z13820 Encounter for screening for osteoporosis: Secondary | ICD-10-CM

## 2013-02-23 DIAGNOSIS — M8580 Other specified disorders of bone density and structure, unspecified site: Secondary | ICD-10-CM

## 2013-02-23 DIAGNOSIS — M899 Disorder of bone, unspecified: Secondary | ICD-10-CM

## 2013-02-24 ENCOUNTER — Ambulatory Visit (INDEPENDENT_AMBULATORY_CARE_PROVIDER_SITE_OTHER): Payer: BC Managed Care – PPO | Admitting: Licensed Clinical Social Worker

## 2013-02-24 DIAGNOSIS — F331 Major depressive disorder, recurrent, moderate: Secondary | ICD-10-CM

## 2013-02-26 ENCOUNTER — Other Ambulatory Visit: Payer: Self-pay | Admitting: *Deleted

## 2013-02-26 DIAGNOSIS — M8580 Other specified disorders of bone density and structure, unspecified site: Secondary | ICD-10-CM

## 2013-03-23 ENCOUNTER — Other Ambulatory Visit: Payer: BC Managed Care – PPO

## 2013-03-23 DIAGNOSIS — M8580 Other specified disorders of bone density and structure, unspecified site: Secondary | ICD-10-CM

## 2013-03-23 LAB — CALCIUM: Calcium: 9.7 mg/dL (ref 8.4–10.5)

## 2013-04-05 ENCOUNTER — Ambulatory Visit (INDEPENDENT_AMBULATORY_CARE_PROVIDER_SITE_OTHER): Payer: BC Managed Care – PPO | Admitting: Licensed Clinical Social Worker

## 2013-04-05 DIAGNOSIS — IMO0002 Reserved for concepts with insufficient information to code with codable children: Secondary | ICD-10-CM

## 2013-04-10 ENCOUNTER — Ambulatory Visit (INDEPENDENT_AMBULATORY_CARE_PROVIDER_SITE_OTHER): Payer: BC Managed Care – PPO | Admitting: Family Medicine

## 2013-04-10 VITALS — BP 126/78 | HR 63 | Temp 98.0°F | Resp 17 | Ht 61.5 in | Wt 102.0 lb

## 2013-04-10 DIAGNOSIS — M25569 Pain in unspecified knee: Secondary | ICD-10-CM

## 2013-04-10 DIAGNOSIS — M25561 Pain in right knee: Secondary | ICD-10-CM

## 2013-04-10 NOTE — Progress Notes (Signed)
Subjective:    Patient ID: Bethany Watts, female    DOB: Aug 13, 1961, 52 y.o.   MRN: [id number] Chief Complaint  Patient presents with  . Knee Pain    right knee pain after yoga     HPI  She did an triangle pose with a straight knee when the knee should have been bent and heard a popping sound. Since then she has had right knee pain - initially there was a bruise below the lateral knee but now starting hurting more medially.  No weakness, no locking or catching, no swelling. No trouble walking. Not giving out. Has been increasing diet in ginger - a natural anti-inflammatory, but not trying any otc medications.  Past Medical History  Diagnosis Date  . Back injury 2003    LEFT SIDED  . Hiatal hernia   . Ankle fracture 11-12    RIGHT-DUE TO A FALL  . Fibroid   . Anemia    Current Outpatient Prescriptions on File Prior to Visit  Medication Sig Dispense Refill  . calcium-vitamin D (OSCAL WITH D) 500-200 MG-UNIT per tablet Take 1 tablet by mouth daily.        Marland Kitchen estradiol (ESTRACE) 0.5 MG tablet Take 1 tablet (0.5 mg total) by mouth daily.  30 tablet  12  . Multiple Vitamin (MULTIVITAMIN) tablet Take 1 tablet by mouth daily.         No current facility-administered medications on file prior to visit.   No Known Allergies   Review of Systems  Constitutional: Positive for activity change. Negative for fever, chills, diaphoresis, fatigue and unexpected weight change.  Musculoskeletal: Positive for arthralgias. Negative for myalgias, back pain, joint swelling and gait problem.  Skin: Negative for color change and rash.  Neurological: Negative for weakness and numbness.  Psychiatric/Behavioral: Negative for sleep disturbance.      BP 126/78  Pulse 63  Temp(Src) 98 F (36.7 C) (Oral)  Resp 17  Ht 5' 1.5" (1.562 m)  Wt 102 lb (46.267 kg)  BMI 18.96 kg/m2  SpO2 100% Objective:   Physical Exam  Constitutional: She is oriented to person, place, and time. She appears well-developed and  well-nourished. No distress.  HENT:  Head: Normocephalic and atraumatic.  Right Ear: External ear normal.  Eyes: Conjunctivae are normal. No scleral icterus.  Pulmonary/Chest: Effort normal.  Musculoskeletal:       Right knee: Normal. She exhibits normal range of motion, no swelling, no effusion, no ecchymosis, no deformity, no laceration, no erythema, normal alignment, no LCL laxity, normal patellar mobility, no bony tenderness, normal meniscus and no MCL laxity. No tenderness found. No medial joint line, no lateral joint line, no MCL, no LCL and no patellar tendon tenderness noted.       Left knee: Normal.  No pain w/ varus and valgus stress. Neg ant/post drawer and Lachman's. No pain or restriction with McMurrays.  Does have some mild crepitus beneath patella Lt>Rt.  Neurological: She is alert and oriented to person, place, and time. She has normal strength. She displays no atrophy. No sensory deficit. She exhibits normal muscle tone. Coordination and gait normal.  Skin: Skin is warm and dry. She is not diaphoretic. No erythema.  Psychiatric: She has a normal mood and affect. Her behavior is normal.      Assessment & Plan:  Knee pain, acute, right - no abnormalities on exam - is very reassuring. advised RICE. Do not resume yoga until pain free but ok to start some stretching exercises for  patellar-femoral syndrome. If still w/ pain in 1-2 wks, RTC for further eval   Meds ordered this encounter  Medications  . loratadine (CLARITIN) 10 MG tablet    Sig: Take 10 mg by mouth daily.

## 2013-04-10 NOTE — Patient Instructions (Addendum)
Ice your knee three times a day for 15 minutes each time.  During the day, wear a soft brace or an ace wrap to provide some compression and stability to the knee.  Do not resume yoga until your knee pain is gone. If you are still having pain in 1 wk or if you get more pain or swelling, please return to clinic and we can proceed with imaging.  Consider starting a glucosamine-chondroitin supplement to help provide additional fluid cushion in your knee.  Patellofemoral Syndrome If you have had pain in the front of your knee for a long time, chances are good that you have patellofemoral syndrome. The word patella refers to the kneecap. Femoral (or femur) refers to the thigh bone. That is the bone the kneecap sits on. The kneecap is shaped like a triangle. Its job is to protect the knee and to improve the efficiency of your thigh muscles (quadriceps). The underside of the kneecap is made of smooth tissue (cartilage). This lets the kneecap slide up and down as the knee moves. Sometimes this cartilage becomes soft. Your healthcare provider may say the cartilage breaks down. That is patellofemoral syndrome. It can affect one knee, or both. The condition is sometimes called patellofemoral pain syndrome. That is because the condition is painful. The pain usually gets worse with activity. Sitting for a long time with the knee bent also makes the pain worse. It usually gets better with rest and proper treatment. CAUSES  No one is sure why some people develop this problem and others do not. Runners often get it. One name for the condition is "runner's knee." However, some people run for years and never have knee pain. Certain things seem to make patellofemoral syndrome more likely. They include:  Moving out of alignment. The kneecap is supposed to move in a straight line when the thigh muscle pulls on it. Sometimes the kneecap moves in poor alignment. That can make the knee swell and hurt. Some experts believe it also  wears down the cartilage.  Injury to the kneecap.  Strain on the knee. This may occur during sports activity. Soccer, running, skiing and cycling can put excess stress on the knee.  Being flat-footed or knock-kneed. SYMPTOMS   Knee pain.  Pain under the kneecap. This is usually a dull, aching pain.  Pain in the knee when doing certain things: squatting, kneeling, going up or down stairs.  Pain in the knee when you stand up after sitting down for awhile.  Tightness in the knee.  Loss of muscle strength in the thigh.  Swelling of the knee. DIAGNOSIS  Healthcare providers often send people with knee pain to an orthopedic caregiver. This person has special training to treat problems with bones and joints. To decide what is causing your knee pain, your caregiver will probably:  Do a physical exam. This will probably include:  Asking about symptoms you have noticed.  Asking about your activities and any injuries.  Feeling your knee. Moving it. This will help test the knee's strength. It will also check alignment (whether the knee and leg are aligned normally).  Order some tests, such as:  Imaging tests. They create pictures of the inside of the knee. Tests may include:  X-rays.  Computed tomography (CT) scan. This uses X-rays and a computer to show more detail.  Magnetic resonance imaging (MRI). This test uses magnets, radio waves and a computer to make pictures. TREATMENT   Medication is almost always used first. It  can relieve pain. It also can reduce swelling. Non-steroidal anti-inflammatory medicines (called NSAIDs) are usually suggested. Sometimes a stronger form is needed. A stronger form would require a prescription.  Other treatment may be needed after the swelling goes down. Possibilities include:  Exercise. Certain exercises can make the muscles around the knee stronger which decreases the pressure on the knee cap. This includes the thigh muscle. Certain exercises  also may be suggested to increase your flexibility.  A knee brace. This gives the knee extra support and helps align the movement of the knee cap.  Orthotics. These are special shoe inserts. They can help keep your leg and knee aligned.  Surgery is sometimes needed. This is rare. Options include:  Arthroscopy. The surgeon uses a special tool to remove any damaged pieces of the kneecap. Only a few small incisions (cuts) are needed.  Realignment. This is open surgery. The goals are to reduce pressure and fix the way the kneecap moves. HOME CARE INSTRUCTIONS   Take any medication prescribed by your healthcare provider. Follow the directions carefully.  If your knee is swollen:  Put ice or cold packs on it. Do this for 20 to 30 minutes, 3 to 4 times a day.  Keep the knee raised. Make sure it is supported. Put a pillow under it.  Rest your knee. For example, take the elevator instead of the stairs for awhile. Or, take a break from sports activity that strain your knee. Try walking or swimming instead.  Whenever you are active:  Use an elastic bandage on your knee. This gives it support.  After any activity, put ice or cold packs on your knees. Do this for about 10 to 20 minutes.  Make sure you wear shoes that give good support. Make sure they are not worn down. The heels should not slant in or out. SEEK MEDICAL CARE IF:   Knee pain gets worse. Or it does not go away, even after taking pain medicine.  Swelling does not go down.  Your thigh muscle becomes weak.  You have an oral temperature above 102 F (38.9 C). SEEK IMMEDIATE MEDICAL CARE IF:  You have an oral temperature above 102 F (38.9 C), not controlled by medicine. Document Released: 11/20/2009 Document Revised: 02/24/2012 Document Reviewed: 11/20/2009 [facility] Patient Information 2013 [place], [place].

## 2013-05-03 ENCOUNTER — Ambulatory Visit (INDEPENDENT_AMBULATORY_CARE_PROVIDER_SITE_OTHER): Payer: BC Managed Care – PPO | Admitting: Licensed Clinical Social Worker

## 2013-05-03 DIAGNOSIS — IMO0002 Reserved for concepts with insufficient information to code with codable children: Secondary | ICD-10-CM

## 2013-05-31 ENCOUNTER — Ambulatory Visit (INDEPENDENT_AMBULATORY_CARE_PROVIDER_SITE_OTHER): Payer: BC Managed Care – PPO | Admitting: Licensed Clinical Social Worker

## 2013-05-31 DIAGNOSIS — IMO0002 Reserved for concepts with insufficient information to code with codable children: Secondary | ICD-10-CM

## 2013-06-28 ENCOUNTER — Ambulatory Visit (INDEPENDENT_AMBULATORY_CARE_PROVIDER_SITE_OTHER): Payer: BC Managed Care – PPO | Admitting: Licensed Clinical Social Worker

## 2013-06-28 DIAGNOSIS — IMO0002 Reserved for concepts with insufficient information to code with codable children: Secondary | ICD-10-CM

## 2013-07-26 ENCOUNTER — Ambulatory Visit (INDEPENDENT_AMBULATORY_CARE_PROVIDER_SITE_OTHER): Payer: BC Managed Care – PPO | Admitting: Licensed Clinical Social Worker

## 2013-07-26 DIAGNOSIS — IMO0002 Reserved for concepts with insufficient information to code with codable children: Secondary | ICD-10-CM

## 2013-08-23 ENCOUNTER — Ambulatory Visit: Payer: BC Managed Care – PPO | Admitting: Licensed Clinical Social Worker

## 2013-08-25 ENCOUNTER — Ambulatory Visit (INDEPENDENT_AMBULATORY_CARE_PROVIDER_SITE_OTHER): Payer: BC Managed Care – PPO | Admitting: Family Medicine

## 2013-08-25 VITALS — BP 108/76 | HR 72 | Temp 98.6°F | Resp 18 | Ht 60.5 in | Wt 102.8 lb

## 2013-08-25 DIAGNOSIS — T148XXA Other injury of unspecified body region, initial encounter: Secondary | ICD-10-CM

## 2013-08-25 DIAGNOSIS — M79641 Pain in right hand: Secondary | ICD-10-CM

## 2013-08-25 DIAGNOSIS — W5501XA Bitten by cat, initial encounter: Secondary | ICD-10-CM

## 2013-08-25 DIAGNOSIS — M79609 Pain in unspecified limb: Secondary | ICD-10-CM

## 2013-08-25 MED ORDER — DICLOFENAC SODIUM 75 MG PO TBEC: 75.0000 mg | DELAYED_RELEASE_TABLET | Freq: Two times a day (BID) | ORAL | Status: DC

## 2013-08-25 MED ORDER — AMOXICILLIN-POT CLAVULANATE 875-125 MG PO TABS: 1.0000 | ORAL_TABLET | Freq: Two times a day (BID) | ORAL | Status: DC

## 2013-08-25 NOTE — Patient Instructions (Addendum)

## 2013-08-25 NOTE — Progress Notes (Signed)
52 year old [place] woman was bitten by her cat this morning. Has multiple puncture wounds on her right hand and right volar wrist. She had her last tetanus shot 3 years ago and she suffered a abrasion to her left knee.  Patient has noticed increased swelling over the day and stiffness is progressed as well.  Objective: Multiple puncture wounds right hand about index finger and webspace of the thumb as well as volar right radial wrist. She has a moderate amount of ecchymosis below the puncture wound on the right wrist. She has decreased range of motion and stiffness    assessment: Recent cat bite and scratches

## 2013-09-08 ENCOUNTER — Ambulatory Visit (INDEPENDENT_AMBULATORY_CARE_PROVIDER_SITE_OTHER): Payer: BC Managed Care – PPO | Admitting: Licensed Clinical Social Worker

## 2013-09-08 DIAGNOSIS — IMO0002 Reserved for concepts with insufficient information to code with codable children: Secondary | ICD-10-CM

## 2013-10-06 ENCOUNTER — Ambulatory Visit (INDEPENDENT_AMBULATORY_CARE_PROVIDER_SITE_OTHER): Payer: BC Managed Care – PPO | Admitting: Licensed Clinical Social Worker

## 2013-10-06 DIAGNOSIS — IMO0002 Reserved for concepts with insufficient information to code with codable children: Secondary | ICD-10-CM

## 2013-11-03 ENCOUNTER — Ambulatory Visit: Payer: BC Managed Care – PPO | Admitting: Licensed Clinical Social Worker

## 2013-11-24 ENCOUNTER — Ambulatory Visit (INDEPENDENT_AMBULATORY_CARE_PROVIDER_SITE_OTHER): Payer: BC Managed Care – PPO | Admitting: Licensed Clinical Social Worker

## 2013-11-24 DIAGNOSIS — IMO0002 Reserved for concepts with insufficient information to code with codable children: Secondary | ICD-10-CM

## 2013-12-02 ENCOUNTER — Telehealth: Payer: Self-pay

## 2013-12-02 NOTE — Telephone Encounter (Signed)
Patient said her hotflashes have been under control until recently and now they have returned.  She has CE in January but wondered if she should make office visit to see you prior or what you rec?

## 2013-12-02 NOTE — Telephone Encounter (Signed)
How much Vitamin E?

## 2013-12-02 NOTE — Telephone Encounter (Signed)
1 capsule twice daily, nature made brand.

## 2013-12-02 NOTE — Telephone Encounter (Signed)
Ask if she has continued the estradiol, she was started on estradiol 0.5 last year. Could also add vitamin E. twice daily which is showed some relief of hot flashes.

## 2013-12-02 NOTE — Telephone Encounter (Signed)
Left detailed message on her voice mail.

## 2013-12-13 ENCOUNTER — Telehealth: Payer: Self-pay | Admitting: *Deleted

## 2013-12-13 MED ORDER — ESTRADIOL 1 MG PO TABS: 1.0000 mg | ORAL_TABLET | Freq: Every day | ORAL | Status: DC

## 2013-12-13 NOTE — Telephone Encounter (Signed)
We can increase the estradiol 1 mg daily, please call in for patient #[phone] with refills until her annual. Have her call if no relief of symptoms. She has had a hysterectomy.

## 2013-12-13 NOTE — Telephone Encounter (Signed)
Pt is currently taking estradiol 0.5 mg daily states hot flashes returned about mid Nov. Sweating at night and during the day, I informed pt with telephone encounter on 12/02/13 regarding taking Vit. E and pt said she takes vitamins. Please advise

## 2013-12-13 NOTE — Telephone Encounter (Signed)
Pt informed with the below note, rx sent. 

## 2013-12-22 ENCOUNTER — Ambulatory Visit (INDEPENDENT_AMBULATORY_CARE_PROVIDER_SITE_OTHER): Payer: BC Managed Care – PPO | Admitting: Licensed Clinical Social Worker

## 2013-12-22 ENCOUNTER — Ambulatory Visit (INDEPENDENT_AMBULATORY_CARE_PROVIDER_SITE_OTHER): Payer: BC Managed Care – PPO | Admitting: Women's Health

## 2013-12-22 ENCOUNTER — Encounter: Payer: Self-pay | Admitting: Women's Health

## 2013-12-22 DIAGNOSIS — IMO0002 Reserved for concepts with insufficient information to code with codable children: Secondary | ICD-10-CM

## 2013-12-22 DIAGNOSIS — Z113 Encounter for screening for infections with a predominantly sexual mode of transmission: Secondary | ICD-10-CM

## 2013-12-22 DIAGNOSIS — D259 Leiomyoma of uterus, unspecified: Secondary | ICD-10-CM

## 2013-12-22 DIAGNOSIS — D219 Benign neoplasm of connective and other soft tissue, unspecified: Secondary | ICD-10-CM

## 2013-12-22 DIAGNOSIS — Z79890 Hormone replacement therapy (postmenopausal): Secondary | ICD-10-CM

## 2013-12-22 LAB — HIV ANTIBODY (ROUTINE TESTING W REFLEX): HIV: NONREACTIVE

## 2013-12-22 LAB — HEPATITIS B SURFACE ANTIGEN: HEP B S AG: NEGATIVE

## 2013-12-22 LAB — HEPATITIS C ANTIBODY: HCV AB: NEGATIVE

## 2013-12-22 LAB — RPR

## 2013-12-22 MED ORDER — ESTRADIOL 0.5 MG PO TABS: 0.5000 mg | ORAL_TABLET | Freq: Every day | ORAL | Status: DC

## 2013-12-22 NOTE — Progress Notes (Signed)
Patient ID: Bethany Watts, female   DOB: [birth year], 53 y.o.   MRN: [id number] Presents with several menopausal type complaints. Reports good relief of hot flushes on estradiol 0.5 daily since January 2014. [facility] 04/2011 for fibroids and menorrhagia. Had increased hot flushes in December started on estradiol 1 mg, now states increased lower abdominal bloating,  scattered thoughts, hot flushes relieved. Also states sexually active x1 without a condom.  Exam: Appears well.  Menopausal symptoms  Plan: Long discussion on HRT risks of blood clots, strokes, breast cancer. Decreasing dose estradiol .5 mg daily, prescription, proper use reviewed. Will evaluate at annual exam at end of month. If low abdominal bloating persists will check ultrasound. Reviewed importance of condoms if sexually active. HIV, hep B, C., RPR pending.

## 2013-12-22 NOTE — Patient Instructions (Signed)

## 2013-12-24 ENCOUNTER — Telehealth: Payer: Self-pay | Admitting: *Deleted

## 2013-12-24 NOTE — Telephone Encounter (Signed)
Telephone call, states all a small blood clot with cough, no further blood noted, nonproductive cough, no fever, instructed to call if cough persists or if continues to be productive with visible blood.

## 2013-12-24 NOTE — Telephone Encounter (Signed)
Pt had OV on 12/22/13 for several complaints pt noticed that am when clearing her throat her coughed some a small blood clot. Pt said every morning she clears her throat and this would be the first time for this. Pt said you may call her if additional questions at [phone]. Pt said she has no other provider to call. Please advise

## 2014-01-07 ENCOUNTER — Ambulatory Visit (INDEPENDENT_AMBULATORY_CARE_PROVIDER_SITE_OTHER): Payer: BC Managed Care – PPO | Admitting: Women's Health

## 2014-01-07 ENCOUNTER — Encounter: Payer: Self-pay | Admitting: Women's Health

## 2014-01-07 VITALS — BP 120/70 | Ht 61.0 in | Wt 104.0 lb

## 2014-01-07 DIAGNOSIS — E079 Disorder of thyroid, unspecified: Secondary | ICD-10-CM

## 2014-01-07 DIAGNOSIS — Z79890 Hormone replacement therapy (postmenopausal): Secondary | ICD-10-CM

## 2014-01-07 DIAGNOSIS — Z01419 Encounter for gynecological examination (general) (routine) without abnormal findings: Secondary | ICD-10-CM

## 2014-01-07 LAB — CBC WITH DIFFERENTIAL/PLATELET
Basophils Absolute: 0 10*3/uL (ref 0.0–0.1)
Basophils Relative: 1 % (ref 0–1)
EOS PCT: 2 % (ref 0–5)
Eosinophils Absolute: 0.1 10*3/uL (ref 0.0–0.7)
HEMATOCRIT: 40.4 % (ref 36.0–46.0)
Hemoglobin: 13.5 g/dL (ref 12.0–15.0)
Lymphocytes Relative: 29 % (ref 12–46)
Lymphs Abs: 1.7 10*3/uL (ref 0.7–4.0)
MCH: 31.3 pg (ref 26.0–34.0)
MCHC: 33.4 g/dL (ref 30.0–36.0)
MCV: 93.5 fL (ref 78.0–100.0)
MONO ABS: 0.4 10*3/uL (ref 0.1–1.0)
Monocytes Relative: 6 % (ref 3–12)
NEUTROS ABS: 3.6 10*3/uL (ref 1.7–7.7)
NEUTROS PCT: 62 % (ref 43–77)
Platelets: 263 10*3/uL (ref 150–400)
RBC: 4.32 MIL/uL (ref 3.87–5.11)
RDW: 13.8 % (ref 11.5–15.5)
WBC: 5.8 10*3/uL (ref 4.0–10.5)

## 2014-01-07 MED ORDER — ESTRADIOL 0.5 MG PO TABS: 0.5000 mg | ORAL_TABLET | Freq: Every day | ORAL | Status: DC

## 2014-01-07 NOTE — Progress Notes (Signed)
Bethany Watts 1961-02-21 [id number]    History:    Presents for annual exam.  LAVH 53 for fibroids and menorrhagia on estradiol 0.5 daily. 02/2013 DEXA T score -1.3 at spine and left femoral neck, hip average 0 FRAX 3.7%/0.3%. 2012 fractured right ankle  from traumatic fall. Normal Pap and mammogram history. Normal lipid panel.  Past medical history, past surgical history, family history and social history were all reviewed and documented in the EPIC chart. 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 Grandparent with diabetes.  ROS:  A  ROS was performed and pertinent positives and negatives are included.  Exam:  Filed Vitals:   01/07/14 1519  BP: 120/70    General appearance:  Normal Thyroid:  Symmetrical, normal in size, without palpable masses or nodularity. Respiratory  Auscultation:  Clear without wheezing or rhonchi Cardiovascular  Auscultation:  Regular rate, without rubs, murmurs or gallops  Edema/varicosities:  Not grossly evident Abdominal  Soft,nontender, without masses, guarding or rebound.  Liver/spleen:  No organomegaly noted  Hernia:  None appreciated  Skin  Inspection:  Grossly normal   Breasts: Examined lying and sitting.     Right: Without masses, retractions, discharge or axillary adenopathy.     Left: Without masses, retractions, discharge or axillary adenopathy. Gentitourinary   Inguinal/mons:  Normal without inguinal adenopathy  External genitalia:  Normal  BUS/Urethra/Skene's glands:  Normal  Vagina:  Normal  Cervix:  Absent  Uterus:  Absent  Adnexa/parametria:     Rt: Without masses or tenderness.   Lt: Without masses or tenderness.  Anus and perineum: Normal  Digital rectal exam: Normal sphincter tone without palpated masses or tenderness  Assessment/Plan:  53 y.o. [name] G0 for annual exam with no complaints.  LAVH on estradiol Osteopenia  Plan: HRT reviewed, estradiol 0.5 mg by mouth daily prescription, proper use given and reviewed risk for  blood clots, strokes, breast cancer. Continue regular exercise, home safety and fall prevention discussed. SBE's, continue annual mammogram, 3-D tomography reviewed and encouraged, history of dense breasts. CBC, TSH, UA. Instructed to schedule screening colonoscopy.  Huel Cote [facility], 3:50 PM 01/07/2014

## 2014-01-07 NOTE — Patient Instructions (Signed)
Dr Collene Mares or anyone at [place]  For colonoscopy Health Recommendations for Postmenopausal Women Respected and ongoing research has looked at the most common causes of death, disability, and poor quality of life in postmenopausal women. The causes include heart disease, diseases of blood vessels, diabetes, depression, cancer, and bone loss (osteoporosis). Many things can be done to help lower the chances of developing these and other common problems: CARDIOVASCULAR DISEASE Heart Disease: A heart attack is a medical emergency. Know the signs and symptoms of a heart attack. Below are things women can do to reduce their risk for heart disease.   Do not smoke. If you smoke, quit.  Aim for a healthy weight. Being overweight causes many preventable deaths. Eat a healthy and balanced diet and drink an adequate amount of liquids.  Get moving. Make a commitment to be more physically active. Aim for 30 minutes of activity on most, if not all days of the week.  Eat for heart health. Choose a diet that is low in saturated fat and cholesterol and eliminate trans fat. Include whole grains, vegetables, and fruits. Read and understand the labels on food containers before buying.  Know your numbers. Ask your caregiver to check your blood pressure, cholesterol (total, HDL, LDL, triglycerides) and blood glucose. Work with your caregiver on improving your entire clinical picture.  High blood pressure. Limit or stop your table salt intake (try salt substitute and food seasonings). Avoid salty foods and drinks. Read labels on food containers before buying. Eating well and exercising can help control high blood pressure. STROKE  Stroke is a medical emergency. Stroke may be the result of a blood clot in a blood vessel in the brain or by a brain hemorrhage (bleeding). Know the signs and symptoms of a stroke. To lower the risk of developing a stroke:  Avoid fatty foods.  Quit smoking.  Control your diabetes, blood  pressure, and irregular heart rate. THROMBOPHLEBITIS (BLOOD CLOT) OF THE LEG  Becoming overweight and leading a stationary lifestyle may also contribute to developing blood clots. Controlling your diet and exercising will help lower the risk of developing blood clots. CANCER SCREENING  Breast Cancer: Take steps to reduce your risk of breast cancer.  You should practice "breast self-awareness." This means understanding the normal appearance and feel of your breasts and should include breast self-examination. Any changes detected, no matter how small, should be reported to your caregiver.  After age 71, you should have a clinical breast exam (CBE) every year.  Starting at age 23, you should consider having a mammogram (breast X-ray) every year.  If you have a family history of breast cancer, talk to your caregiver about genetic screening.  If you are at high risk for breast cancer, talk to your caregiver about having an MRI and a mammogram every year.  Intestinal or Stomach Cancer: Tests to consider are a rectal exam, fecal occult blood, sigmoidoscopy, and colonoscopy. Women who are high risk may need to be screened at an earlier age and more often.  Cervical Cancer:  Beginning at age 67, you should have a Pap test every 3 years as long as the past 3 Pap tests have been normal.  If you have had past treatment for cervical cancer or a condition that could lead to cancer, you need Pap tests and screening for cancer for at least 20 years after your treatment.  If you had a hysterectomy for a problem that was not cancer or a condition that could lead  to cancer, then you no longer need Pap tests.  If you are between ages 7465 and 5270, and you have had normal Pap tests going back 10 years, you no longer need Pap tests.  If Pap tests have been discontinued, risk factors (such as a new sexual partner) need to be reassessed to determine if screening should be resumed.  Some medical problems can  increase the chance of getting cervical cancer. In these cases, your caregiver may recommend more frequent screening and Pap tests.  Uterine Cancer: If you have vaginal bleeding after reaching menopause, you should notify your caregiver.  Ovarian cancer: Other than yearly pelvic exams, there are no reliable tests available to screen for ovarian cancer at this time except for yearly pelvic exams.  Lung Cancer: Yearly chest X-rays can detect lung cancer and should be done on high risk women, such as cigarette smokers and women with chronic lung disease (emphysema).  Skin Cancer: A complete body skin exam should be done at your yearly examination. Avoid overexposure to the sun and ultraviolet light lamps. Use a strong sun block cream when in the sun. All of these things are important in lowering the risk of skin cancer. MENOPAUSE Menopause Symptoms: Hormone therapy products are effective for treating symptoms associated with menopause:  Moderate to severe hot flashes.  Night sweats.  Mood swings.  Headaches.  Tiredness.  Loss of sex drive.  Insomnia.  Other symptoms. Hormone replacement carries certain risks, especially in older women. Women who use or are thinking about using estrogen or estrogen with progestin treatments should discuss that with their caregiver. Your caregiver will help you understand the benefits and risks. The ideal dose of hormone replacement therapy is not known. The Food and Drug Administration (FDA) has concluded that hormone therapy should be used only at the lowest doses and for the shortest amount of time to reach treatment goals.  OSTEOPOROSIS Protecting Against Bone Loss and Preventing Fracture: If you use hormone therapy for prevention of bone loss (osteoporosis), the risks for bone loss must outweigh the risk of the therapy. Ask your caregiver about other medications known to be safe and effective for preventing bone loss and fractures. To guard against bone  loss or fractures, the following is recommended:  If you are less than age 52, take 1000 mg of calcium and at least 600 mg of Vitamin D per day.  If you are greater than age 850 but less than age 52, take 1200 mg of calcium and at least 600 mg of Vitamin D per day.  If you are greater than age 52, take 1200 mg of calcium and at least 800 mg of Vitamin D per day. Smoking and excessive alcohol intake increases the risk of osteoporosis. Eat foods rich in calcium and vitamin D and do weight bearing exercises several times a week as your caregiver suggests. DIABETES Diabetes Melitus: If you have Type I or Type 2 diabetes, you should keep your blood sugar under control with diet, exercise and recommended medication. Avoid too many sweets, starchy and fatty foods. Being overweight can make control more difficult. COGNITION AND MEMORY Cognition and Memory: Menopausal hormone therapy is not recommended for the prevention of cognitive disorders such as Alzheimer's disease or memory loss.  DEPRESSION  Depression may occur at any age, but is common in elderly women. The reasons may be because of physical, medical, social (loneliness), or financial problems and needs. If you are experiencing depression because of medical problems and control of  symptoms, talk to your caregiver about this. Physical activity and exercise may help with mood and sleep. Community and volunteer involvement may help your sense of value and worth. If you have depression and you feel that the problem is getting worse or becoming severe, talk to your caregiver about treatment options that are best for you. ACCIDENTS  Accidents are common and can be serious in the elderly woman. Prepare your house to prevent accidents. Eliminate throw rugs, place hand bars in the bath, shower and toilet areas. Avoid wearing high heeled shoes or walking on wet, snowy, and icy areas. Limit or stop driving if you have vision or hearing problems, or you feel you  are unsteady with you movements and reflexes. HEPATITIS C Hepatitis C is a type of viral infection affecting the liver. It is spread mainly through contact with blood from an infected person. It can be treated, but if left untreated, it can lead to severe liver damage over years. Many people who are infected do not know that the virus is in their blood. If you are a "baby-boomer", it is recommended that you have one screening test for Hepatitis C. IMMUNIZATIONS  Several immunizations are important to consider having during your senior years, including:   Tetanus, diptheria, and pertussis booster shot.  Influenza every year before the flu season begins.  Pneumonia vaccine.  Shingles vaccine.  Others as indicated based on your specific needs. Talk to your caregiver about these. Document Released: 01/24/2006 Document Revised: 11/18/2012 Document Reviewed: 09/19/2008 [facility] Patient Information 2014 [place].

## 2014-01-08 LAB — URINALYSIS W MICROSCOPIC + REFLEX CULTURE
BACTERIA UA: NONE SEEN
BILIRUBIN URINE: NEGATIVE
Casts: NONE SEEN
Crystals: NONE SEEN
GLUCOSE, UA: NEGATIVE mg/dL
Hgb urine dipstick: NEGATIVE
Ketones, ur: NEGATIVE mg/dL
LEUKOCYTES UA: NEGATIVE
NITRITE: NEGATIVE
PH: 6.5 (ref 5.0–8.0)
Protein, ur: NEGATIVE mg/dL
SPECIFIC GRAVITY, URINE: 1.005 (ref 1.005–1.030)
Squamous Epithelial / LPF: NONE SEEN
UROBILINOGEN UA: 0.2 mg/dL (ref 0.0–1.0)

## 2014-01-08 LAB — TSH: TSH: 1.696 u[IU]/mL (ref 0.350–4.500)

## 2014-01-24 ENCOUNTER — Ambulatory Visit (INDEPENDENT_AMBULATORY_CARE_PROVIDER_SITE_OTHER): Payer: BC Managed Care – PPO | Admitting: Licensed Clinical Social Worker

## 2014-01-24 DIAGNOSIS — IMO0002 Reserved for concepts with insufficient information to code with codable children: Secondary | ICD-10-CM

## 2014-02-21 ENCOUNTER — Ambulatory Visit (INDEPENDENT_AMBULATORY_CARE_PROVIDER_SITE_OTHER): Payer: BC Managed Care – PPO | Admitting: Licensed Clinical Social Worker

## 2014-02-21 DIAGNOSIS — IMO0002 Reserved for concepts with insufficient information to code with codable children: Secondary | ICD-10-CM

## 2014-02-25 ENCOUNTER — Encounter: Payer: Self-pay | Admitting: Women's Health

## 2014-02-27 ENCOUNTER — Encounter: Payer: Self-pay | Admitting: Women's Health

## 2014-03-28 ENCOUNTER — Ambulatory Visit (INDEPENDENT_AMBULATORY_CARE_PROVIDER_SITE_OTHER): Payer: BC Managed Care – PPO | Admitting: Licensed Clinical Social Worker

## 2014-03-28 DIAGNOSIS — IMO0002 Reserved for concepts with insufficient information to code with codable children: Secondary | ICD-10-CM

## 2014-04-20 ENCOUNTER — Ambulatory Visit (INDEPENDENT_AMBULATORY_CARE_PROVIDER_SITE_OTHER): Payer: BC Managed Care – PPO | Admitting: Podiatry

## 2014-04-20 ENCOUNTER — Ambulatory Visit (INDEPENDENT_AMBULATORY_CARE_PROVIDER_SITE_OTHER): Payer: BC Managed Care – PPO

## 2014-04-20 ENCOUNTER — Encounter: Payer: Self-pay | Admitting: Podiatry

## 2014-04-20 VITALS — BP 135/84 | HR 67 | Resp 12

## 2014-04-20 DIAGNOSIS — M2040 Other hammer toe(s) (acquired), unspecified foot: Secondary | ICD-10-CM

## 2014-04-20 DIAGNOSIS — R52 Pain, unspecified: Secondary | ICD-10-CM

## 2014-04-20 DIAGNOSIS — M2010 Hallux valgus (acquired), unspecified foot: Secondary | ICD-10-CM

## 2014-04-20 DIAGNOSIS — M779 Enthesopathy, unspecified: Secondary | ICD-10-CM

## 2014-04-20 DIAGNOSIS — M898X9 Other specified disorders of bone, unspecified site: Secondary | ICD-10-CM

## 2014-04-20 MED ORDER — TRIAMCINOLONE ACETONIDE 10 MG/ML IJ SUSP
10.0000 mg | Freq: Once | INTRAMUSCULAR | Status: AC
  Administered 2014-04-20: 10 mg

## 2014-04-20 NOTE — Progress Notes (Signed)
   Subjective:    Patient ID: Bethany Watts, female    DOB: Dec 23, 1960, 53 y.o.   MRN: [id number]  HPI PT STATED LT FOOT BUNION IS PAINFUL AND HAVE REDNESS. THE FOOT IS GETTING WORSE. THE FOOT GET AGGRAVATED BY WEARING SHOES AND PRESSURE, BUT TRIED NO TREATMENT    Review of Systems  HENT: Positive for sneezing.        Objective:   Physical Exam        Assessment & Plan:

## 2014-04-21 ENCOUNTER — Telehealth: Payer: Self-pay | Admitting: *Deleted

## 2014-04-21 NOTE — Telephone Encounter (Signed)
I have a question about my surgery.  I'm trying to plan a trip.  How long will it be before I can start walking?  I'm trying to decide if I should do my surgery sooner or plan my trip after the surgery.  I informed her it takes about 12 weeks for bone healing.  After surgery will only be able to do minimal walking.  She said so no long walks afterwards for a while I told her yes.  She said that is what she needed to know.  She asked me to go ahead and schedule surgery for 06/15/2014.  She'll discuss it with her husband.  Will call if she needs to change the date.

## 2014-04-21 NOTE — Progress Notes (Signed)
Subjective:     Patient ID: Bethany Watts, female   DOB: March 05, 1961, 53 y.o.   MRN: [id number]  HPI patient presents with painful red bunion deformity left that is increasingly difficult to wear shoe gear with and pain in the outside of the left fifth toe with lesion formation that she cannot trim and get comfortable with. States it's been present for a while and getting gradually worse   Review of Systems  All other systems reviewed and are negative.      Objective:   Physical Exam  Nursing note and vitals reviewed. Constitutional: She is oriented to person, place, and time.  Cardiovascular: Intact distal pulses.   Musculoskeletal: Normal range of motion.  Neurological: She is oriented to person, place, and time.  Skin: Skin is warm.   neurovascular status found to be intact with muscle strength adequate and range of motion of the subtalar and midtarsal joint within normal limits. 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 At this time I advised on structural correction and we reviewed this and she wants to get it fixed in the summer but she is first going to [place] and needs to wait until after that trip is completed. I discussed [place] bunionectomy distal transverse arthroplasty digit 5 left and distal lateral exostectomy. Today I did inject around the first MPJ 3 mg Kenalog 5 mg Xylocaine Marcaine mixture in order to reduce inflammation and make her comfortable for the next several months. Scheduled for consult for surgery

## 2014-04-21 NOTE — Telephone Encounter (Signed)
I want to discuss my surgery scheduled for 06/15/2014.

## 2014-04-25 ENCOUNTER — Ambulatory Visit: Payer: BC Managed Care – PPO | Admitting: Licensed Clinical Social Worker

## 2014-05-02 ENCOUNTER — Ambulatory Visit (INDEPENDENT_AMBULATORY_CARE_PROVIDER_SITE_OTHER): Payer: BC Managed Care – PPO | Admitting: Licensed Clinical Social Worker

## 2014-05-02 DIAGNOSIS — IMO0002 Reserved for concepts with insufficient information to code with codable children: Secondary | ICD-10-CM

## 2014-05-06 ENCOUNTER — Telehealth: Payer: Self-pay | Admitting: *Deleted

## 2014-05-06 ENCOUNTER — Ambulatory Visit: Payer: BC Managed Care – PPO | Admitting: Podiatrist

## 2014-05-06 NOTE — Telephone Encounter (Signed)
Surgery was canceled at [facility]  I informed the [facility] office as well as Dr. Paulla Dolly.

## 2014-05-06 NOTE — Telephone Encounter (Signed)
Message copied by Lolita Rieger on Fri May 06, 2014 12:14 PM ------      Message from: [place], [place] M      Created: Thu May 05, 2014  4:24 PM      Regarding: cancellation of sx       Pt requested to cxl her sx scheduled for 06-15-14. Did not want to resch at this time. ------

## 2014-05-16 ENCOUNTER — Ambulatory Visit (INDEPENDENT_AMBULATORY_CARE_PROVIDER_SITE_OTHER): Payer: BC Managed Care – PPO | Admitting: Licensed Clinical Social Worker

## 2014-05-16 DIAGNOSIS — IMO0002 Reserved for concepts with insufficient information to code with codable children: Secondary | ICD-10-CM

## 2014-05-18 ENCOUNTER — Ambulatory Visit: Payer: BC Managed Care – PPO | Admitting: Podiatry

## 2014-06-03 ENCOUNTER — Ambulatory Visit: Payer: BC Managed Care – PPO | Admitting: Licensed Clinical Social Worker

## 2014-06-22 ENCOUNTER — Encounter: Payer: BC Managed Care – PPO | Admitting: Podiatrist

## 2014-06-24 ENCOUNTER — Ambulatory Visit (INDEPENDENT_AMBULATORY_CARE_PROVIDER_SITE_OTHER): Payer: BC Managed Care – PPO | Admitting: Licensed Clinical Social Worker

## 2014-06-24 DIAGNOSIS — IMO0002 Reserved for concepts with insufficient information to code with codable children: Secondary | ICD-10-CM

## 2014-07-20 ENCOUNTER — Ambulatory Visit (INDEPENDENT_AMBULATORY_CARE_PROVIDER_SITE_OTHER): Payer: BC Managed Care – PPO | Admitting: Licensed Clinical Social Worker

## 2014-07-20 DIAGNOSIS — IMO0002 Reserved for concepts with insufficient information to code with codable children: Secondary | ICD-10-CM

## 2014-08-31 ENCOUNTER — Ambulatory Visit (INDEPENDENT_AMBULATORY_CARE_PROVIDER_SITE_OTHER): Payer: BC Managed Care – PPO | Admitting: Licensed Clinical Social Worker

## 2014-08-31 DIAGNOSIS — IMO0002 Reserved for concepts with insufficient information to code with codable children: Secondary | ICD-10-CM

## 2014-09-26 ENCOUNTER — Ambulatory Visit (INDEPENDENT_AMBULATORY_CARE_PROVIDER_SITE_OTHER): Payer: BC Managed Care – PPO | Admitting: Licensed Clinical Social Worker

## 2014-09-26 DIAGNOSIS — F3341 Major depressive disorder, recurrent, in partial remission: Secondary | ICD-10-CM

## 2014-09-30 ENCOUNTER — Other Ambulatory Visit: Payer: Self-pay

## 2014-10-24 ENCOUNTER — Ambulatory Visit: Payer: BC Managed Care – PPO | Admitting: Licensed Clinical Social Worker

## 2014-11-21 ENCOUNTER — Ambulatory Visit (INDEPENDENT_AMBULATORY_CARE_PROVIDER_SITE_OTHER): Payer: BC Managed Care – PPO | Admitting: Licensed Clinical Social Worker

## 2014-11-21 DIAGNOSIS — F3341 Major depressive disorder, recurrent, in partial remission: Secondary | ICD-10-CM

## 2015-01-10 ENCOUNTER — Ambulatory Visit (INDEPENDENT_AMBULATORY_CARE_PROVIDER_SITE_OTHER): Payer: BC Managed Care – PPO | Admitting: Women's Health

## 2015-01-10 ENCOUNTER — Encounter: Payer: Self-pay | Admitting: Women's Health

## 2015-01-10 VITALS — BP 124/80 | Ht 61.0 in | Wt 101.0 lb

## 2015-01-10 DIAGNOSIS — Z13220 Encounter for screening for lipoid disorders: Secondary | ICD-10-CM

## 2015-01-10 DIAGNOSIS — Z79890 Hormone replacement therapy (postmenopausal): Secondary | ICD-10-CM

## 2015-01-10 DIAGNOSIS — Z01419 Encounter for gynecological examination (general) (routine) without abnormal findings: Secondary | ICD-10-CM

## 2015-01-10 DIAGNOSIS — M8580 Other specified disorders of bone density and structure, unspecified site: Secondary | ICD-10-CM

## 2015-01-10 DIAGNOSIS — K648 Other hemorrhoids: Secondary | ICD-10-CM

## 2015-01-10 DIAGNOSIS — Z113 Encounter for screening for infections with a predominantly sexual mode of transmission: Secondary | ICD-10-CM

## 2015-01-10 LAB — COMPREHENSIVE METABOLIC PANEL
ALT: 20 U/L (ref 0–35)
AST: 24 U/L (ref 0–37)
Albumin: 4.3 g/dL (ref 3.5–5.2)
Alkaline Phosphatase: 39 U/L (ref 39–117)
BUN: 9 mg/dL (ref 6–23)
CHLORIDE: 102 meq/L (ref 96–112)
CO2: 31 mEq/L (ref 19–32)
CREATININE: 0.7 mg/dL (ref 0.50–1.10)
Calcium: 9.5 mg/dL (ref 8.4–10.5)
GLUCOSE: 84 mg/dL (ref 70–99)
POTASSIUM: 4.2 meq/L (ref 3.5–5.3)
Sodium: 136 mEq/L (ref 135–145)
Total Bilirubin: 0.4 mg/dL (ref 0.2–1.2)
Total Protein: 6.9 g/dL (ref 6.0–8.3)

## 2015-01-10 LAB — LIPID PANEL
Cholesterol: 180 mg/dL (ref 0–200)
HDL: 87 mg/dL (ref >39)
LDL CALC: 75 mg/dL (ref 0–99)
Total CHOL/HDL Ratio: 2.1 Ratio
Triglycerides: 91 mg/dL (ref <150)
VLDL: 18 mg/dL (ref 0–40)

## 2015-01-10 LAB — CBC WITH DIFFERENTIAL/PLATELET
BASOS ABS: 0 10*3/uL (ref 0.0–0.1)
BASOS PCT: 1 % (ref 0–1)
EOS ABS: 0.1 10*3/uL (ref 0.0–0.7)
Eosinophils Relative: 3 % (ref 0–5)
HCT: 40 % (ref 36.0–46.0)
HEMOGLOBIN: 13.5 g/dL (ref 12.0–15.0)
Lymphocytes Relative: 32 % (ref 12–46)
Lymphs Abs: 1.4 10*3/uL (ref 0.7–4.0)
MCH: 31.7 pg (ref 26.0–34.0)
MCHC: 33.8 g/dL (ref 30.0–36.0)
MCV: 93.9 fL (ref 78.0–100.0)
MONO ABS: 0.3 10*3/uL (ref 0.1–1.0)
MPV: 10.1 fL (ref 8.6–12.4)
Monocytes Relative: 7 % (ref 3–12)
NEUTROS PCT: 57 % (ref 43–77)
Neutro Abs: 2.6 10*3/uL (ref 1.7–7.7)
PLATELETS: 240 10*3/uL (ref 150–400)
RBC: 4.26 MIL/uL (ref 3.87–5.11)
RDW: 13.7 % (ref 11.5–15.5)
WBC: 4.5 10*3/uL (ref 4.0–10.5)

## 2015-01-10 MED ORDER — ESTRADIOL 0.5 MG PO TABS: 0.5000 mg | ORAL_TABLET | Freq: Every day | ORAL | Status: DC

## 2015-01-10 MED ORDER — HYDROCORTISONE ACE-PRAMOXINE 2.5-1 % RE CREA: 1.0000 "application " | TOPICAL_CREAM | Freq: Three times a day (TID) | RECTAL | Status: DC

## 2015-01-10 NOTE — Patient Instructions (Signed)
Health Recommendations for Postmenopausal Women Respected and ongoing research has looked at the most common causes of death, disability, and poor quality of life in postmenopausal women. The causes include heart disease, diseases of blood vessels, diabetes, depression, cancer, and bone loss (osteoporosis). Many things can be done to help lower the chances of developing these and other common problems. CARDIOVASCULAR DISEASE Heart Disease: A heart attack is a medical emergency. Know the signs and symptoms of a heart attack. Below are things women can do to reduce their risk for heart disease.   Do not smoke. If you smoke, quit.  Aim for a healthy weight. Being overweight causes many preventable deaths. Eat a healthy and balanced diet and drink an adequate amount of liquids.  Get moving. Make a commitment to be more physically active. Aim for 30 minutes of activity on most, if not all days of the week.  Eat for heart health. Choose a diet that is low in saturated fat and cholesterol and eliminate trans fat. Include whole grains, vegetables, and fruits. Read and understand the labels on food containers before buying.  Know your numbers. Ask your caregiver to check your blood pressure, cholesterol (total, HDL, LDL, triglycerides) and blood glucose. Work with your caregiver on improving your entire clinical picture.  High blood pressure. Limit or stop your table salt intake (try salt substitute and food seasonings). Avoid salty foods and drinks. Read labels on food containers before buying. Eating well and exercising can help control high blood pressure. STROKE  Stroke is a medical emergency. Stroke may be the result of a blood clot in a blood vessel in the brain or by a brain hemorrhage (bleeding). Know the signs and symptoms of a stroke. To lower the risk of developing a stroke:  Avoid fatty foods.  Quit smoking.  Control your diabetes, blood pressure, and irregular heart rate. THROMBOPHLEBITIS  (BLOOD CLOT) OF THE LEG  Becoming overweight and leading a stationary lifestyle may also contribute to developing blood clots. Controlling your diet and exercising will help lower the risk of developing blood clots. CANCER SCREENING  Breast Cancer: Take steps to reduce your risk of breast cancer.  You should practice "breast self-awareness." This means understanding the normal appearance and feel of your breasts and should include breast self-examination. Any changes detected, no matter how small, should be reported to your caregiver.  After age 40, you should have a clinical breast exam (CBE) every year.  Starting at age 40, you should consider having a mammogram (breast X-ray) every year.  If you have a family history of breast cancer, talk to your caregiver about genetic screening.  If you are at high risk for breast cancer, talk to your caregiver about having an MRI and a mammogram every year.  Intestinal or Stomach Cancer: Tests to consider are a rectal exam, fecal occult blood, sigmoidoscopy, and colonoscopy. Women who are high risk may need to be screened at an earlier age and more often.  Cervical Cancer:  Beginning at age 30, you should have a Pap test every 3 years as long as the past 3 Pap tests have been normal.  If you have had past treatment for cervical cancer or a condition that could lead to cancer, you need Pap tests and screening for cancer for at least 20 years after your treatment.  If you had a hysterectomy for a problem that was not cancer or a condition that could lead to cancer, then you no longer need Pap tests.    If you are between ages 65 and 70, and you have had normal Pap tests going back 10 years, you no longer need Pap tests.  If Pap tests have been discontinued, risk factors (such as a new sexual partner) need to be reassessed to determine if screening should be resumed.  Some medical problems can increase the chance of getting cervical cancer. In these  cases, your caregiver may recommend more frequent screening and Pap tests.  Uterine Cancer: If you have vaginal bleeding after reaching menopause, you should notify your caregiver.  Ovarian Cancer: Other than yearly pelvic exams, there are no reliable tests available to screen for ovarian cancer at this time except for yearly pelvic exams.  Lung Cancer: Yearly chest X-rays can detect lung cancer and should be done on high risk women, such as cigarette smokers and women with chronic lung disease (emphysema).  Skin Cancer: A complete body skin exam should be done at your yearly examination. Avoid overexposure to the sun and ultraviolet light lamps. Use a strong sun block cream when in the sun. All of these things are important for lowering the risk of skin cancer. MENOPAUSE Menopause Symptoms: Hormone therapy products are effective for treating symptoms associated with menopause:  Moderate to severe hot flashes.  Night sweats.  Mood swings.  Headaches.  Tiredness.  Loss of sex drive.  Insomnia.  Other symptoms. Hormone replacement carries certain risks, especially in older women. Women who use or are thinking about using estrogen or estrogen with progestin treatments should discuss that with their caregiver. Your caregiver will help you understand the benefits and risks. The ideal dose of hormone replacement therapy is not known. The Food and Drug Administration (FDA) has concluded that hormone therapy should be used only at the lowest doses and for the shortest amount of time to reach treatment goals.  OSTEOPOROSIS Protecting Against Bone Loss and Preventing Fracture If you use hormone therapy for prevention of bone loss (osteoporosis), the risks for bone loss must outweigh the risk of the therapy. Ask your caregiver about other medications known to be safe and effective for preventing bone loss and fractures. To guard against bone loss or fractures, the following is recommended:  If  you are younger than age 50, take 1000 mg of calcium and at least 600 mg of Vitamin D per day.  If you are older than age 50 but younger than age 70, take 1200 mg of calcium and at least 600 mg of Vitamin D per day.  If you are older than age 70, take 1200 mg of calcium and at least 800 mg of Vitamin D per day. Smoking and excessive alcohol intake increases the risk of osteoporosis. Eat foods rich in calcium and vitamin D and do weight bearing exercises several times a week as your caregiver suggests. DIABETES Diabetes Mellitus: If you have type I or type 2 diabetes, you should keep your blood sugar under control with diet, exercise, and recommended medication. Avoid starchy and fatty foods, and too many sweets. Being overweight can make diabetes control more difficult. COGNITION AND MEMORY Cognition and Memory: Menopausal hormone therapy is not recommended for the prevention of cognitive disorders such as Alzheimer's disease or memory loss.  DEPRESSION  Depression may occur at any age, but it is common in elderly women. This may be because of physical, medical, social (loneliness), or financial problems and needs. If you are experiencing depression because of medical problems and control of symptoms, talk to your caregiver about this. Physical   activity and exercise may help with mood and sleep. Community and volunteer involvement may improve your sense of value and worth. If you have depression and you feel that the problem is getting worse or becoming severe, talk to your caregiver about which treatment options are best for you. ACCIDENTS  Accidents are common and can be serious in elderly woman. Prepare your house to prevent accidents. Eliminate throw rugs, place hand bars in bath, shower, and toilet areas. Avoid wearing high heeled shoes or walking on wet, snowy, and icy areas. Limit or stop driving if you have vision or hearing problems, or if you feel you are unsteady with your movements and  reflexes. HEPATITIS C Hepatitis C is a type of viral infection affecting the liver. It is spread mainly through contact with blood from an infected person. It can be treated, but if left untreated, it can lead to severe liver damage over the years. Many people who are infected do not know that the virus is in their blood. If you are a "baby-boomer", it is recommended that you have one screening test for Hepatitis C. IMMUNIZATIONS  Several immunizations are important to consider having during your senior years, including:   Tetanus, diphtheria, and pertussis booster shot.  Influenza every year before the flu season begins.  Pneumonia vaccine.  Shingles vaccine.  Others, as indicated based on your specific needs. Talk to your caregiver about these. Document Released: 01/24/2006 Document Revised: 04/18/2014 Document Reviewed: 09/19/2008 [employer] Patient Information 2015 [employer]. This information is not intended to replace advice given to you by your health care provider. Make sure you discuss any questions you have with your health care provider.  

## 2015-01-10 NOTE — Progress Notes (Signed)
Bethany Watts [birth year] [id number]    History:    Presents for annual exam.  2012 LAVH for fibroids and menorrhagia on estradiol daily. 2014 T score -1.3 at spine FRAX 3.7%/0.3%. Normal Pap and mammogram history. New partner.  Past medical history, past surgical history, family history and social history were all reviewed and documented in the EPIC chart. 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  Exam:  Filed Vitals:   01/10/15 1526  BP: 124/80    General appearance:  Normal Thyroid:  Symmetrical, normal in size, without palpable masses or nodularity. Respiratory  Auscultation:  Clear without wheezing or rhonchi Cardiovascular  Auscultation:  Regular rate, without rubs, murmurs or gallops  Edema/varicosities:  Not grossly evident Abdominal  Soft,nontender, without masses, guarding or rebound.  Liver/spleen:  No organomegaly noted  Hernia:  None appreciated  Skin  Inspection:  Grossly normal   Breasts: Examined lying and sitting.     Right: Without masses, retractions, discharge or axillary adenopathy.     Left: Without masses, retractions, discharge or axillary adenopathy. Gentitourinary   Inguinal/mons:  Normal without inguinal adenopathy  External genitalia:  Normal  BUS/Urethra/Skene's glands:  Normal  Vagina:  Normal  Cervix: and-uterus absent              Adnexa/parametria:     Rt: Without masses or tenderness.   Lt: Without masses or tenderness.  Anus and perineum: Normal  Digital rectal exam: Normal sphincter tone without palpated masses or tenderness  Assessment/Plan:  54 y.o. SAF G0 for annual exam.    LAVH for fibroids and menorrhagia on estradiol with occasional hot flushes Osteopenia without elevated FRAX STD screen  Plan: Will repeat DEXA next year. Home safety, fall prevention and importance of continued exercise discussed. Estradiol 0.5 mg daily prescription, proper use, risk for blood clots,  strokes and breast cancer reviewed. SBE's, continue annual screening mammogram, 3-D tomography reviewed and encouraged history of dense breasts. CBC, CMP, lipid panel, vitamin D, UA, HIV, hep B, C, RPR. Analpram prescription, proper use given for occasional hemorrhoidal discomfort.  Huel Cote [facility], 5:24 PM 01/10/2015

## 2015-01-11 ENCOUNTER — Encounter: Payer: Self-pay | Admitting: Women's Health

## 2015-01-11 LAB — URINALYSIS W MICROSCOPIC + REFLEX CULTURE
Bacteria, UA: NONE SEEN
Bilirubin Urine: NEGATIVE
CRYSTALS: NONE SEEN
Casts: NONE SEEN
Glucose, UA: NEGATIVE mg/dL
HGB URINE DIPSTICK: NEGATIVE
Ketones, ur: NEGATIVE mg/dL
LEUKOCYTES UA: NEGATIVE
Nitrite: NEGATIVE
PH: 7.5 (ref 5.0–8.0)
PROTEIN: NEGATIVE mg/dL
SPECIFIC GRAVITY, URINE: 1.006 (ref 1.005–1.030)
Urobilinogen, UA: 0.2 mg/dL (ref 0.0–1.0)

## 2015-01-11 LAB — HEPATITIS B SURFACE ANTIGEN: Hepatitis B Surface Ag: NEGATIVE

## 2015-01-11 LAB — RPR

## 2015-01-11 LAB — VITAMIN D 25 HYDROXY (VIT D DEFICIENCY, FRACTURES): VIT D 25 HYDROXY: 50 ng/mL (ref 30–100)

## 2015-01-11 LAB — HIV ANTIBODY (ROUTINE TESTING W REFLEX): HIV 1&2 Ab, 4th Generation: NONREACTIVE

## 2015-01-11 LAB — HEPATITIS C ANTIBODY: HCV AB: NEGATIVE

## 2015-01-16 ENCOUNTER — Ambulatory Visit (INDEPENDENT_AMBULATORY_CARE_PROVIDER_SITE_OTHER): Payer: BC Managed Care – PPO | Admitting: Licensed Clinical Social Worker

## 2015-01-16 DIAGNOSIS — F3341 Major depressive disorder, recurrent, in partial remission: Secondary | ICD-10-CM

## 2015-02-02 ENCOUNTER — Other Ambulatory Visit: Payer: Self-pay

## 2015-02-02 DIAGNOSIS — Z79890 Hormone replacement therapy (postmenopausal): Secondary | ICD-10-CM

## 2015-02-02 MED ORDER — ESTRADIOL 0.5 MG PO TABS: 0.5000 mg | ORAL_TABLET | Freq: Every day | ORAL | Status: DC

## 2015-02-27 ENCOUNTER — Ambulatory Visit (INDEPENDENT_AMBULATORY_CARE_PROVIDER_SITE_OTHER): Payer: BC Managed Care – PPO | Admitting: Licensed Clinical Social Worker

## 2015-02-27 DIAGNOSIS — F3341 Major depressive disorder, recurrent, in partial remission: Secondary | ICD-10-CM | POA: Diagnosis not present

## 2015-03-15 ENCOUNTER — Encounter: Payer: Self-pay | Admitting: Gynecology

## 2015-04-26 ENCOUNTER — Ambulatory Visit (INDEPENDENT_AMBULATORY_CARE_PROVIDER_SITE_OTHER): Payer: BC Managed Care – PPO | Admitting: Licensed Clinical Social Worker

## 2015-04-26 DIAGNOSIS — F3341 Major depressive disorder, recurrent, in partial remission: Secondary | ICD-10-CM | POA: Diagnosis not present

## 2015-05-29 ENCOUNTER — Ambulatory Visit (INDEPENDENT_AMBULATORY_CARE_PROVIDER_SITE_OTHER): Payer: BC Managed Care – PPO | Admitting: Licensed Clinical Social Worker

## 2015-05-29 DIAGNOSIS — F3341 Major depressive disorder, recurrent, in partial remission: Secondary | ICD-10-CM | POA: Diagnosis not present

## 2015-06-28 ENCOUNTER — Ambulatory Visit: Payer: BC Managed Care – PPO | Admitting: Licensed Clinical Social Worker

## 2015-07-10 ENCOUNTER — Ambulatory Visit (INDEPENDENT_AMBULATORY_CARE_PROVIDER_SITE_OTHER): Payer: BC Managed Care – PPO | Admitting: Licensed Clinical Social Worker

## 2015-07-10 DIAGNOSIS — F3341 Major depressive disorder, recurrent, in partial remission: Secondary | ICD-10-CM | POA: Diagnosis not present

## 2015-07-31 ENCOUNTER — Ambulatory Visit (INDEPENDENT_AMBULATORY_CARE_PROVIDER_SITE_OTHER): Payer: BC Managed Care – PPO | Admitting: Licensed Clinical Social Worker

## 2015-07-31 DIAGNOSIS — F3341 Major depressive disorder, recurrent, in partial remission: Secondary | ICD-10-CM

## 2016-01-16 ENCOUNTER — Encounter: Payer: BC Managed Care – PPO | Admitting: Women's Health

## 2016-01-23 ENCOUNTER — Ambulatory Visit (INDEPENDENT_AMBULATORY_CARE_PROVIDER_SITE_OTHER): Payer: BC Managed Care – PPO | Admitting: Women's Health

## 2016-01-23 ENCOUNTER — Encounter: Payer: Self-pay | Admitting: Women's Health

## 2016-01-23 VITALS — BP 120/80 | Ht 60.0 in | Wt 104.0 lb

## 2016-01-23 DIAGNOSIS — M899 Disorder of bone, unspecified: Secondary | ICD-10-CM

## 2016-01-23 DIAGNOSIS — M8580 Other specified disorders of bone density and structure, unspecified site: Secondary | ICD-10-CM

## 2016-01-23 DIAGNOSIS — Z01419 Encounter for gynecological examination (general) (routine) without abnormal findings: Secondary | ICD-10-CM | POA: Diagnosis not present

## 2016-01-23 DIAGNOSIS — Z1329 Encounter for screening for other suspected endocrine disorder: Secondary | ICD-10-CM | POA: Diagnosis not present

## 2016-01-23 DIAGNOSIS — Z79890 Hormone replacement therapy (postmenopausal): Secondary | ICD-10-CM | POA: Diagnosis not present

## 2016-01-23 DIAGNOSIS — Z13820 Encounter for screening for osteoporosis: Secondary | ICD-10-CM | POA: Diagnosis not present

## 2016-01-23 DIAGNOSIS — Z13220 Encounter for screening for lipoid disorders: Secondary | ICD-10-CM

## 2016-01-23 LAB — CBC WITH DIFFERENTIAL/PLATELET
BASOS PCT: 0 % (ref 0–1)
Basophils Absolute: 0 10*3/uL (ref 0.0–0.1)
Eosinophils Absolute: 0.1 10*3/uL (ref 0.0–0.7)
Eosinophils Relative: 2 % (ref 0–5)
HEMATOCRIT: 41.2 % (ref 36.0–46.0)
HEMOGLOBIN: 13.8 g/dL (ref 12.0–15.0)
LYMPHS ABS: 1.7 10*3/uL (ref 0.7–4.0)
LYMPHS PCT: 37 % (ref 12–46)
MCH: 31.4 pg (ref 26.0–34.0)
MCHC: 33.5 g/dL (ref 30.0–36.0)
MCV: 93.6 fL (ref 78.0–100.0)
MONO ABS: 0.3 10*3/uL (ref 0.1–1.0)
MPV: 9.8 fL (ref 8.6–12.4)
Monocytes Relative: 7 % (ref 3–12)
NEUTROS ABS: 2.5 10*3/uL (ref 1.7–7.7)
NEUTROS PCT: 54 % (ref 43–77)
Platelets: 245 10*3/uL (ref 150–400)
RBC: 4.4 MIL/uL (ref 3.87–5.11)
RDW: 13.5 % (ref 11.5–15.5)
WBC: 4.7 10*3/uL (ref 4.0–10.5)

## 2016-01-23 LAB — TSH: TSH: 2.31 mIU/L

## 2016-01-23 MED ORDER — ESTRADIOL 0.5 MG PO TABS: 0.5000 mg | ORAL_TABLET | Freq: Every day | ORAL | Status: DC

## 2016-01-23 MED ORDER — METRONIDAZOLE 0.75 % VA GEL: VAGINAL | Status: DC

## 2016-01-23 NOTE — Progress Notes (Signed)
Bethany Watts 1961-12-11 [id number]    History:    Presents for annual exam.  2012 TVH for menorrhagia on estradiol 0.5 with good relief of hot flashes. 2014 T score -1.3 FRAX 3.7%/0.3%. Normal Pap and mammogram history. Same partner with negative STD screen.  Past medical history, past surgical history, family history and social history were all reviewed and documented in the EPIC chart. 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  Exam:  Filed Vitals:   01/23/16 1457  BP: 120/80    General appearance:  Normal Thyroid:  Symmetrical, normal in size, without palpable masses or nodularity. Respiratory  Auscultation:  Clear without wheezing or rhonchi Cardiovascular  Auscultation:  Regular rate, without rubs, murmurs or gallops  Edema/varicosities:  Not grossly evident Abdominal  Soft,nontender, without masses, guarding or rebound.  Liver/spleen:  No organomegaly noted  Hernia:  None appreciated  Skin  Inspection:  Grossly normal   Breasts: Examined lying and sitting.     Right: Without masses, retractions, discharge or axillary adenopathy.     Left: Without masses, retractions, discharge or axillary adenopathy. Gentitourinary   Inguinal/mons:  Normal without inguinal adenopathy  External genitalia:  Normal  BUS/Urethra/Skene's glands:  Normal  Vagina:  Normal  Cervix:  Uterus absent Adnexa/parametria:     Rt: Without masses or tenderness.   Lt: Without masses or tenderness.  Anus and perineum: Normal  Digital rectal exam: Normal sphincter tone without palpated masses or tenderness  Assessment/Plan:  55 y.o. SAF G0 for annual exam with no complaints.  TVH on estradiol Osteopenia without elevated FRAX  Plan: HRT reviewed best to be on shortest amount of time, estradiol 0.5 prescription, proper use given and reviewed risk for blood clots, strokes and breast cancer. SBE's, continue annual 3-D screening mammogram history  of dense breast. Continue active lifestyle, martial arts, yoga healthy diet. Repeat DEXA, home safety, fall prevention discussed. CBC, lipid panel, CMP, TSH, UA, continue vitamin D over-the-counter 2000 daily. Normal vitamin D level.  Huel Cote [facility], 3:59 PM 01/23/2016

## 2016-01-23 NOTE — Patient Instructions (Signed)
DMenopause is a normal process in which your reproductive ability comes to an end. This process happens gradually over a span of months to years, usually between the ages of 75 and 63. Menopause is complete when you have missed 12 consecutive menstrual periods. It is important to talk with your health care provider about some of the most common conditions that affect postmenopausal women, such as heart disease, cancer, and bone loss (osteoporosis). Adopting a healthy lifestyle and getting preventive care can help to promote your health and wellness. Those actions can also lower your chances of developing some of these common conditions. WHAT SHOULD I KNOW ABOUT MENOPAUSE? During menopause, you may experience a number of symptoms, such as:  Moderate-to-severe hot flashes.  Night sweats.  Decrease in sex drive.  Mood swings.  Headaches.  Tiredness.  Irritability.  Memory problems.  Insomnia. Choosing to treat or not to treat menopausal changes is an individual decision that you make with your health care provider. WHAT SHOULD I KNOW ABOUT HORMONE REPLACEMENT THERAPY AND SUPPLEMENTS? Hormone therapy products are effective for treating symptoms that are associated with menopause, such as hot flashes and night sweats. Hormone replacement carries certain risks, especially as you become older. If you are thinking about using estrogen or estrogen with progestin treatments, discuss the benefits and risks with your health care provider. WHAT SHOULD I KNOW ABOUT HEART DISEASE AND STROKE? Heart disease, heart attack, and stroke become more likely as you age. This may be due, in part, to the hormonal changes that your body experiences during menopause. These can affect how your body processes dietary fats, triglycerides, and cholesterol. Heart attack and stroke are both medical emergencies. There are many things that you can do to help prevent heart disease and stroke:  Have your blood pressure  checked at least every 1-2 years. High blood pressure causes heart disease and increases the risk of stroke.  If you are 70-11 years old, ask your health care provider if you should take aspirin to prevent a heart attack or a stroke.  Do not use any tobacco products, including cigarettes, chewing tobacco, or electronic cigarettes. If you need help quitting, ask your health care provider.  It is important to eat a healthy diet and maintain a healthy weight.  Be sure to include plenty of vegetables, fruits, low-fat dairy products, and lean protein.  Avoid eating foods that are high in solid fats, added sugars, or salt (sodium).  Get regular exercise. This is one of the most important things that you can do for your health.  Try to exercise for at least 150 minutes each week. The type of exercise that you do should increase your heart rate and make you sweat. This is known as moderate-intensity exercise.  Try to do strengthening exercises at least twice each week. Do these in addition to the moderate-intensity exercise.  Know your numbers.Ask your health care provider to check your cholesterol and your blood glucose. Continue to have your blood tested as directed by your health care provider. WHAT SHOULD I KNOW ABOUT CANCER SCREENING? There are several types of cancer. Take the following steps to reduce your risk and to catch any cancer development as early as possible. Breast Cancer  Practice breast self-awareness.  This means understanding how your breasts normally appear and feel.  It also means doing regular breast self-exams. Let your health care provider know about any changes, no matter how small.  If you are 8 or older, have a clinician do a  breast exam (clinical breast exam or CBE) every year. Depending on your age, family history, and medical history, it may be recommended that you also have a yearly breast X-ray (mammogram).  If you have a family history of breast cancer,  talk with your health care provider about genetic screening.  If you are at high risk for breast cancer, talk with your health care provider about having an MRI and a mammogram every year.  Breast cancer (BRCA) gene test is recommended for women who have family members with BRCA-related cancers. Results of the assessment will determine the need for genetic counseling and BRCA1 and for BRCA2 testing. BRCA-related cancers include these types:  Breast. This occurs in males or females.  Ovarian.  Tubal. This may also be called fallopian tube cancer.  Cancer of the abdominal or pelvic lining (peritoneal cancer).  Prostate.  Pancreatic. Cervical, Uterine, and Ovarian Cancer Your health care provider may recommend that you be screened regularly for cancer of the pelvic organs. These include your ovaries, uterus, and vagina. This screening involves a pelvic exam, which includes checking for microscopic changes to the surface of your cervix (Pap test).  For women ages 21-65, health care providers may recommend a pelvic exam and a Pap test every three years. For women ages 77-65, they may recommend the Pap test and pelvic exam, combined with testing for human papilloma virus (HPV), every five years. Some types of HPV increase your risk of cervical cancer. Testing for HPV may also be done on women of any age who have unclear Pap test results.  Other health care providers may not recommend any screening for nonpregnant women who are considered low risk for pelvic cancer and have no symptoms. Ask your health care provider if a screening pelvic exam is right for you.  If you have had past treatment for cervical cancer or a condition that could lead to cancer, you need Pap tests and screening for cancer for at least 20 years after your treatment. If Pap tests have been discontinued for you, your risk factors (such as having a new sexual partner) need to be reassessed to determine if you should start having  screenings again. Some women have medical problems that increase the chance of getting cervical cancer. In these cases, your health care provider may recommend that you have screening and Pap tests more often.  If you have a family history of uterine cancer or ovarian cancer, talk with your health care provider about genetic screening.  If you have vaginal bleeding after reaching menopause, tell your health care provider.  There are currently no reliable tests available to screen for ovarian cancer. Lung Cancer Lung cancer screening is recommended for adults 3-70 years old who are at high risk for lung cancer because of a history of smoking. A yearly low-dose CT scan of the lungs is recommended if you:  Currently smoke.  Have a history of at least 30 pack-years of smoking and you currently smoke or have quit within the past 15 years. A pack-year is smoking an average of one pack of cigarettes per day for one year. Yearly screening should:  Continue until it has been 15 years since you quit.  Stop if you develop a health problem that would prevent you from having lung cancer treatment. Colorectal Cancer  This type of cancer can be detected and can often be prevented.  Routine colorectal cancer screening usually begins at age 38 and continues through age 12.  If you have  risk factors for colon cancer, your health care provider may recommend that you be screened at an earlier age.  If you have a family history of colorectal cancer, talk with your health care provider about genetic screening.  Your health care provider may also recommend using home test kits to check for hidden blood in your stool.  A small camera at the end of a tube can be used to examine your colon directly (sigmoidoscopy or colonoscopy). This is done to check for the earliest forms of colorectal cancer.  Direct examination of the colon should be repeated every 5-10 years until age 67. However, if early forms of  precancerous polyps or small growths are found or if you have a family history or genetic risk for colorectal cancer, you may need to be screened more often. Skin Cancer  Check your skin from head to toe regularly.  Monitor any moles. Be sure to tell your health care provider:  About any new moles or changes in moles, especially if there is a change in a mole's shape or color.  If you have a mole that is larger than the size of a pencil eraser.  If any of your family members has a history of skin cancer, especially at a Arman Loy age, talk with your health care provider about genetic screening.  Always use sunscreen. Apply sunscreen liberally and repeatedly throughout the day.  Whenever you are outside, protect yourself by wearing long sleeves, pants, a wide-brimmed hat, and sunglasses. WHAT SHOULD I KNOW ABOUT OSTEOPOROSIS? Osteoporosis is a condition in which bone destruction happens more quickly than new bone creation. After menopause, you may be at an increased risk for osteoporosis. To help prevent osteoporosis or the bone fractures that can happen because of osteoporosis, the following is recommended:  If you are 39-61 years old, get at least 1,000 mg of calcium and at least 600 mg of vitamin D per day.  If you are older than age 16 but younger than age 7, get at least 1,200 mg of calcium and at least 600 mg of vitamin D per day.  If you are older than age 47, get at least 1,200 mg of calcium and at least 800 mg of vitamin D per day. Smoking and excessive alcohol intake increase the risk of osteoporosis. Eat foods that are rich in calcium and vitamin D, and do weight-bearing exercises several times each week as directed by your health care provider. WHAT SHOULD I KNOW ABOUT HOW MENOPAUSE AFFECTS [place]? Depression may occur at any age, but it is more common as you become older. Common symptoms of depression include:  Low or sad mood.  Changes in sleep patterns.  Changes  in appetite or eating patterns.  Feeling an overall lack of motivation or enjoyment of activities that you previously enjoyed.  Frequent crying spells. Talk with your health care provider if you think that you are experiencing depression. WHAT SHOULD I KNOW ABOUT IMMUNIZATIONS? It is important that you get and maintain your immunizations. These include:  Tetanus, diphtheria, and pertussis (Tdap) booster vaccine.  Influenza every year before the flu season begins.  Pneumonia vaccine.  Shingles vaccine. Your health care provider may also recommend other immunizations.   This information is not intended to replace advice given to you by your health care provider. Make sure you discuss any questions you have with your health care provider.   Document Released: 01/24/2006 Document Revised: 12/23/2014 Document Reviewed: 08/04/2014 Elsevier Interactive Patient Education 2016 [employer]   Dr Celine Ahr GI  Colonscopy  [phone] Dr Collene Mares  [phone]

## 2016-01-24 ENCOUNTER — Encounter: Payer: Self-pay | Admitting: Women's Health

## 2016-01-24 LAB — LIPID PANEL
CHOL/HDL RATIO: 2.1 ratio (ref <=5.0)
CHOLESTEROL: 179 mg/dL (ref 125–200)
HDL: 86 mg/dL (ref >=46)
LDL Cholesterol: 72 mg/dL (ref <130)
TRIGLYCERIDES: 107 mg/dL (ref <150)
VLDL: 21 mg/dL (ref <30)

## 2016-01-24 LAB — URINALYSIS W MICROSCOPIC + REFLEX CULTURE
Bacteria, UA: NONE SEEN [HPF]
Bilirubin Urine: NEGATIVE
CRYSTALS: NONE SEEN [HPF]
Casts: NONE SEEN [LPF]
Glucose, UA: NEGATIVE
Hgb urine dipstick: NEGATIVE
KETONES UR: NEGATIVE
Nitrite: NEGATIVE
Protein, ur: NEGATIVE
RBC / HPF: NONE SEEN RBC/HPF (ref <=2)
Specific Gravity, Urine: 1.006 (ref 1.001–1.035)
Yeast: NONE SEEN [HPF]
pH: 7 (ref 5.0–8.0)

## 2016-01-24 LAB — COMPREHENSIVE METABOLIC PANEL
ALBUMIN: 4.3 g/dL (ref 3.6–5.1)
ALK PHOS: 45 U/L (ref 33–130)
ALT: 22 U/L (ref 6–29)
AST: 24 U/L (ref 10–35)
BUN: 10 mg/dL (ref 7–25)
CALCIUM: 9.1 mg/dL (ref 8.6–10.4)
CO2: 28 mmol/L (ref 20–31)
Chloride: 102 mmol/L (ref 98–110)
Creat: 0.7 mg/dL (ref 0.50–1.05)
Glucose, Bld: 88 mg/dL (ref 65–99)
Potassium: 4.1 mmol/L (ref 3.5–5.3)
Sodium: 139 mmol/L (ref 135–146)
Total Bilirubin: 0.4 mg/dL (ref 0.2–1.2)
Total Protein: 7 g/dL (ref 6.1–8.1)

## 2016-01-26 LAB — URINE CULTURE: Colony Count: >=100000

## 2016-02-06 ENCOUNTER — Other Ambulatory Visit: Payer: Self-pay | Admitting: Women's Health

## 2016-02-06 MED ORDER — CIPROFLOXACIN HCL 250 MG PO TABS: 250.0000 mg | ORAL_TABLET | Freq: Two times a day (BID) | ORAL | Status: DC

## 2016-03-05 ENCOUNTER — Ambulatory Visit (INDEPENDENT_AMBULATORY_CARE_PROVIDER_SITE_OTHER): Payer: BC Managed Care – PPO

## 2016-03-05 ENCOUNTER — Ambulatory Visit (INDEPENDENT_AMBULATORY_CARE_PROVIDER_SITE_OTHER): Payer: BC Managed Care – PPO | Admitting: Family Medicine

## 2016-03-05 VITALS — BP 133/85 | HR 56 | Temp 98.4°F | Resp 16 | Ht 61.0 in | Wt 105.2 lb

## 2016-03-05 DIAGNOSIS — M25521 Pain in right elbow: Secondary | ICD-10-CM

## 2016-03-05 DIAGNOSIS — M778 Other enthesopathies, not elsewhere classified: Secondary | ICD-10-CM | POA: Diagnosis not present

## 2016-03-05 DIAGNOSIS — M7711 Lateral epicondylitis, right elbow: Secondary | ICD-10-CM | POA: Diagnosis not present

## 2016-03-05 DIAGNOSIS — M779 Enthesopathy, unspecified: Secondary | ICD-10-CM

## 2016-03-05 DIAGNOSIS — T148XXA Other injury of unspecified body region, initial encounter: Secondary | ICD-10-CM

## 2016-03-05 NOTE — Patient Instructions (Signed)
I suspect you do have epicondylitis cysts or tennis elbow syndrome. Use the counterforce brace as we discussed, eccentric exercises as below. Your upper arm pain may be due to overuse, adjuste your training regimen as we discussed with rest as needed, but if symptoms worsen, recheck.  Contusion A contusion is a deep bruise. Contusions are the result of a blunt injury to tissues and muscle fibers under the skin. The injury causes bleeding under the skin. The skin overlying the contusion may turn blue, purple, or yellow. Minor injuries will give you a painless contusion, but more severe contusions may stay painful and swollen for a few weeks.  CAUSES  This condition is usually caused by a blow, trauma, or direct force to an area of the body. SYMPTOMS  Symptoms of this condition include:  Swelling of the injured area.  Pain and tenderness in the injured area.  Discoloration. The area may have redness and then turn blue, purple, or yellow. DIAGNOSIS  This condition is diagnosed based on a physical exam and medical history. An X-ray, CT scan, or MRI may be needed to determine if there are any associated injuries, such as broken bones (fractures). TREATMENT  Specific treatment for this condition depends on what area of the body was injured. In general, the best treatment for a contusion is resting, icing, applying pressure to (compression), and elevating the injured area. This is often called the RICE strategy. Over-the-counter anti-inflammatory medicines may also be recommended for pain control.  HOME CARE INSTRUCTIONS   Rest the injured area.  If directed, apply ice to the injured area:  Put ice in a plastic bag.  Place a towel between your skin and the bag.  Leave the ice on for 20 minutes, 2-3 times per day.  If directed, apply light compression to the injured area using an elastic bandage. Make sure the bandage is not wrapped too tightly. Remove and reapply the bandage as directed by  your health care provider.  If possible, raise (elevate) the injured area above the level of your heart while you are sitting or lying down.  Take over-the-counter and prescription medicines only as told by your health care provider. SEEK MEDICAL CARE IF:  Your symptoms do not improve after several days of treatment.  Your symptoms get worse.  You have difficulty moving the injured area. SEEK IMMEDIATE MEDICAL CARE IF:   You have severe pain.  You have numbness in a hand or foot.  Your hand or foot turns pale or cold.   This information is not intended to replace advice given to you by your health care provider. Make sure you discuss any questions you have with your health care provider.   Document Released: 09/11/2005 Document Revised: 08/23/2015 Document Reviewed: 04/19/2015 [employer] Interactive Patient Education 2016 [employer]   Lateral Epicondylitis With Rehab Lateral epicondylitis involves inflammation and pain around the outer portion of the elbow. The pain is caused by inflammation of the tendons in the forearm that bring back (extend) the wrist. Lateral epicondylitis is also called tennis elbow, because it is very common in tennis players. However, it may occur in any individual who extends the wrist repetitively. If lateral epicondylitis is left untreated, it may become a chronic problem. SYMPTOMS   Pain, tenderness, and inflammation on the outer (lateral) side of the elbow.  Pain or weakness with gripping activities.  Pain that increases with wrist-twisting motions (playing tennis, using a screwdriver, opening a door or a jar).  Pain with lifting  objects, including a coffee cup. CAUSES  Lateral epicondylitis is caused by inflammation of the tendons that extend the wrist. Causes of injury may include:  Repetitive stress and strain on the muscles and tendons that extend the wrist.  Sudden change in activity level or intensity.  Incorrect grip in racquet  sports.  Incorrect grip size of racquet (often too large).  Incorrect hitting position or technique (usually backhand, leading with the elbow).  Using a racket that is too heavy. RISK INCREASES WITH:  Sports or occupations that require repetitive and/or strenuous forearm and wrist movements (tennis, squash, racquetball, carpentry).  Poor wrist and forearm strength and flexibility.  Failure to warm up properly before activity.  Resuming activity before healing, rehabilitation, and conditioning are complete. PREVENTION   Warm up and stretch properly before activity.  Maintain physical fitness:  Strength, flexibility, and endurance.  Cardiovascular fitness.  Wear and use properly fitted equipment.  Learn and use proper technique and have a coach correct improper technique.  Wear a tennis elbow (counterforce) brace. PROGNOSIS  The course of this condition depends on the degree of the injury. If treated properly, acute cases (symptoms lasting less than 4 weeks) are often resolved in 2 to 6 weeks. Chronic (longer lasting cases) often resolve in 3 to 6 months but may require physical therapy. RELATED COMPLICATIONS   Frequently recurring symptoms, resulting in a chronic problem. Properly treating the problem the first time decreases frequency of recurrence.  Chronic inflammation, scarring tendon degeneration, and partial tendon tear, requiring surgery.  Delayed healing or resolution of symptoms. TREATMENT  Treatment first involves the use of ice and medicine to reduce pain and inflammation. Strengthening and stretching exercises may help reduce discomfort if performed regularly. These exercises may be performed at home if the condition is an acute injury. Chronic cases may require a referral to a physical therapist for evaluation and treatment. Your caregiver may advise a corticosteroid injection to help reduce inflammation. Rarely, surgery is needed. MEDICATION  If pain medicine  is needed, nonsteroidal anti-inflammatory medicines (aspirin and ibuprofen), or other minor pain relievers (acetaminophen), are often advised.  Do not take pain medicine for 7 days before surgery.  Prescription pain relievers may be given, if your caregiver thinks they are needed. Use only as directed and only as much as you need.  Corticosteroid injections may be recommended. These injections should be reserved only for the most severe cases, because they can only be given a certain number of times. HEAT AND COLD  Cold treatment (icing) should be applied for 10 to 15 minutes every 2 to 3 hours for inflammation and pain, and immediately after activity that aggravates your symptoms. Use ice packs or an ice massage.  Heat treatment may be used before performing stretching and strengthening activities prescribed by your caregiver, physical therapist, or athletic trainer. Use a heat pack or a warm water soak. SEEK MEDICAL CARE IF: Symptoms get worse or do not improve in 2 weeks, despite treatment. EXERCISES  RANGE OF MOTION (ROM) AND STRETCHING EXERCISES - Epicondylitis, Lateral (Tennis Elbow) These exercises may help you when beginning to rehabilitate your injury. Your symptoms may go away with or without further involvement from your physician, physical therapist, or athletic trainer. While completing these exercises, remember:   Restoring tissue flexibility helps normal motion to return to the joints. This allows healthier, less painful movement and activity.  An effective stretch should be held for at least 30 seconds.  A stretch should never be painful. You  should only feel a gentle lengthening or release in the stretched tissue. RANGE OF MOTION - Wrist Flexion, Active-Assisted  Extend your right / left elbow with your fingers pointing down.*  Gently pull the back of your hand towards you, until you feel a gentle stretch on the top of your forearm.  Hold this position for __________  seconds. Repeat __________ times. Complete this exercise __________ times per day.  *If directed by your physician, physical therapist or athletic trainer, complete this stretch with your elbow bent, rather than extended. RANGE OF MOTION - Wrist Extension, Active-Assisted  Extend your right / left elbow and turn your palm upwards.*  Gently pull your palm and fingertips back, so your wrist extends and your fingers point more toward the ground.  You should feel a gentle stretch on the inside of your forearm.  Hold this position for __________ seconds. Repeat __________ times. Complete this exercise __________ times per day. *If directed by your physician, physical therapist or athletic trainer, complete this stretch with your elbow bent, rather than extended. STRETCH - Wrist Flexion  Place the back of your right / left hand on a tabletop, leaving your elbow slightly bent. Your fingers should point away from your body.  Gently press the back of your hand down onto the table by straightening your elbow. You should feel a stretch on the top of your forearm.  Hold this position for __________ seconds. Repeat __________ times. Complete this stretch __________ times per day.  STRETCH - Wrist Extension   Place your right / left fingertips on a tabletop, leaving your elbow slightly bent. Your fingers should point backwards.  Gently press your fingers and palm down onto the table by straightening your elbow. You should feel a stretch on the inside of your forearm.  Hold this position for __________ seconds. Repeat __________ times. Complete this stretch __________ times per day.  STRENGTHENING EXERCISES - Epicondylitis, Lateral (Tennis Elbow) These exercises may help you when beginning to rehabilitate your injury. They may resolve your symptoms with or without further involvement from your physician, physical therapist, or athletic trainer. While completing these exercises, remember:   Muscles  can gain both the endurance and the strength needed for everyday activities through controlled exercises.  Complete these exercises as instructed by your physician, physical therapist or athletic trainer. Increase the resistance and repetitions only as guided.  You may experience muscle soreness or fatigue, but the pain or discomfort you are trying to eliminate should never worsen during these exercises. If this pain does get worse, stop and make sure you are following the directions exactly. If the pain is still present after adjustments, discontinue the exercise until you can discuss the trouble with your caregiver. STRENGTH - Wrist Flexors  Sit with your right / left forearm palm-up and fully supported on a table or countertop. Your elbow should be resting below the height of your shoulder. Allow your wrist to extend over the edge of the surface.  Loosely holding a __________ weight, or a piece of rubber exercise band or tubing, slowly curl your hand up toward your forearm.  Hold this position for __________ seconds. Slowly lower the wrist back to the starting position in a controlled manner. Repeat __________ times. Complete this exercise __________ times per day.  STRENGTH - Wrist Extensors  Sit with your right / left forearm palm-down and fully supported on a table or countertop. Your elbow should be resting below the height of your shoulder. Allow your wrist to  extend over the edge of the surface.  Loosely holding a __________ weight, or a piece of rubber exercise band or tubing, slowly curl your hand up toward your forearm.  Hold this position for __________ seconds. Slowly lower the wrist back to the starting position in a controlled manner. Repeat __________ times. Complete this exercise __________ times per day.  STRENGTH - Ulnar Deviators  Stand with a ____________________ weight in your right / left hand, or sit while holding a rubber exercise band or tubing, with your healthy arm  supported on a table or countertop.  Move your wrist, so that your pinkie travels toward your forearm and your thumb moves away from your forearm.  Hold this position for __________ seconds and then slowly lower the wrist back to the starting position. Repeat __________ times. Complete this exercise __________ times per day STRENGTH - Radial Deviators  Stand with a ____________________ weight in your right / left hand, or sit while holding a rubber exercise band or tubing, with your injured arm supported on a table or countertop.  Raise your hand upward in front of you or pull up on the rubber tubing.  Hold this position for __________ seconds and then slowly lower the wrist back to the starting position. Repeat __________ times. Complete this exercise __________ times per day. STRENGTH - Forearm Supinators   Sit with your right / left forearm supported on a table, keeping your elbow below shoulder height. Rest your hand over the edge, palm down.  Gently grip a hammer or a soup ladle.  Without moving your elbow, slowly turn your palm and hand upward to a "thumbs-up" position.  Hold this position for __________ seconds. Slowly return to the starting position. Repeat __________ times. Complete this exercise __________ times per day.  STRENGTH - Forearm Pronators   Sit with your right / left forearm supported on a table, keeping your elbow below shoulder height. Rest your hand over the edge, palm up.  Gently grip a hammer or a soup ladle.  Without moving your elbow, slowly turn your palm and hand upward to a "thumbs-up" position.  Hold this position for __________ seconds. Slowly return to the starting position. Repeat __________ times. Complete this exercise __________ times per day.  STRENGTH - Grip  Grasp a tennis ball, a dense sponge, or a large, rolled sock in your hand.  Squeeze as hard as you can, without increasing any pain.  Hold this position for __________ seconds.  Release your grip slowly. Repeat __________ times. Complete this exercise __________ times per day.  STRENGTH - Elbow Extensors, Isometric  Stand or sit upright, on a firm surface. Place your right / left arm so that your palm faces your stomach, and it is at the height of your waist.  Place your opposite hand on the underside of your forearm. Gently push up as your right / left arm resists. Push as hard as you can with both arms, without causing any pain or movement at your right / left elbow. Hold this stationary position for __________ seconds. Gradually release the tension in both arms. Allow your muscles to relax completely before repeating.   This information is not intended to replace advice given to you by your health care provider. Make sure you discuss any questions you have with your health care provider.   Document Released: 12/02/2005 Document Revised: 12/23/2014 Document Reviewed: 03/16/2009 [employer] Interactive Patient Education [employer].
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
## 2016-03-19 ENCOUNTER — Other Ambulatory Visit: Payer: Self-pay | Admitting: Gynecology

## 2016-03-19 DIAGNOSIS — Z13820 Encounter for screening for osteoporosis: Secondary | ICD-10-CM

## 2016-03-19 DIAGNOSIS — M8580 Other specified disorders of bone density and structure, unspecified site: Secondary | ICD-10-CM

## 2016-04-02 ENCOUNTER — Encounter: Payer: Self-pay | Admitting: Gynecology

## 2016-05-14 ENCOUNTER — Telehealth: Payer: Self-pay | Admitting: *Deleted

## 2016-05-14 NOTE — Telephone Encounter (Signed)
Pt called c/o body aches and muscle tenderness, I advised pt to be seen at urgent care to been seen, as this is not GYN related. Pt verbalized she understood.

## 2016-05-16 ENCOUNTER — Ambulatory Visit (INDEPENDENT_AMBULATORY_CARE_PROVIDER_SITE_OTHER): Payer: BC Managed Care – PPO

## 2016-05-16 DIAGNOSIS — Z13820 Encounter for screening for osteoporosis: Secondary | ICD-10-CM | POA: Diagnosis not present

## 2016-05-16 DIAGNOSIS — M8580 Other specified disorders of bone density and structure, unspecified site: Secondary | ICD-10-CM

## 2016-05-16 DIAGNOSIS — M899 Disorder of bone, unspecified: Secondary | ICD-10-CM | POA: Diagnosis not present

## 2017-02-18 ENCOUNTER — Ambulatory Visit (INDEPENDENT_AMBULATORY_CARE_PROVIDER_SITE_OTHER): Payer: BC Managed Care – PPO | Admitting: Women's Health

## 2017-02-18 ENCOUNTER — Encounter: Payer: Self-pay | Admitting: Women's Health

## 2017-02-18 VITALS — BP 110/70 | Ht 61.0 in | Wt 110.0 lb

## 2017-02-18 DIAGNOSIS — Z79890 Hormone replacement therapy (postmenopausal): Secondary | ICD-10-CM

## 2017-02-18 DIAGNOSIS — Z01419 Encounter for gynecological examination (general) (routine) without abnormal findings: Secondary | ICD-10-CM | POA: Diagnosis not present

## 2017-02-18 DIAGNOSIS — Z13220 Encounter for screening for lipoid disorders: Secondary | ICD-10-CM

## 2017-02-18 LAB — CBC WITH DIFFERENTIAL/PLATELET
BASOS ABS: 41 {cells}/uL (ref 0–200)
Basophils Relative: 1 %
EOS ABS: 164 {cells}/uL (ref 15–500)
Eosinophils Relative: 4 %
HEMATOCRIT: 40.1 % (ref 35.0–45.0)
HEMOGLOBIN: 13.5 g/dL (ref 11.7–15.5)
LYMPHS ABS: 1681 {cells}/uL (ref 850–3900)
Lymphocytes Relative: 41 %
MCH: 32.1 pg (ref 27.0–33.0)
MCHC: 33.7 g/dL (ref 32.0–36.0)
MCV: 95.2 fL (ref 80.0–100.0)
MPV: 10 fL (ref 7.5–12.5)
Monocytes Absolute: 287 cells/uL (ref 200–950)
Monocytes Relative: 7 %
NEUTROS PCT: 47 %
Neutro Abs: 1927 cells/uL (ref 1500–7800)
Platelets: 233 10*3/uL (ref 140–400)
RBC: 4.21 MIL/uL (ref 3.80–5.10)
RDW: 13.5 % (ref 11.0–15.0)
WBC: 4.1 10*3/uL (ref 3.8–10.8)

## 2017-02-18 LAB — LIPID PANEL
CHOL/HDL RATIO: 2 ratio (ref <5.0)
CHOLESTEROL: 179 mg/dL (ref <200)
HDL: 91 mg/dL (ref >50)
LDL CALC: 66 mg/dL (ref <100)
TRIGLYCERIDES: 109 mg/dL (ref <150)
VLDL: 22 mg/dL (ref <30)

## 2017-02-18 LAB — COMPREHENSIVE METABOLIC PANEL
ALT: 21 U/L (ref 6–29)
AST: 25 U/L (ref 10–35)
Albumin: 4.2 g/dL (ref 3.6–5.1)
Alkaline Phosphatase: 36 U/L (ref 33–130)
BUN: 13 mg/dL (ref 7–25)
CALCIUM: 9.6 mg/dL (ref 8.6–10.4)
CHLORIDE: 104 mmol/L (ref 98–110)
CO2: 29 mmol/L (ref 20–31)
Creat: 0.81 mg/dL (ref 0.50–1.05)
Glucose, Bld: 92 mg/dL (ref 65–99)
POTASSIUM: 3.8 mmol/L (ref 3.5–5.3)
Sodium: 141 mmol/L (ref 135–146)
Total Bilirubin: 0.3 mg/dL (ref 0.2–1.2)
Total Protein: 6.7 g/dL (ref 6.1–8.1)

## 2017-02-18 MED ORDER — METRONIDAZOLE 0.75 % VA GEL: VAGINAL | 1 refills | Status: DC

## 2017-02-18 MED ORDER — ESTRADIOL 0.5 MG PO TABS: 0.5000 mg | ORAL_TABLET | Freq: Every day | ORAL | 4 refills | Status: DC

## 2017-02-18 NOTE — Progress Notes (Signed)
Bethany Watts [birth year] [id number]    History:    Presents for annual exam.  2012 TVH for menorrhagia on estradiol without complaint. 2017 T score -1.2 at left femoral neck FRAX 4.6/0.3% DEXA stable. Normal Pap and mammogram history. Has not had a screening colonoscopy.   Past medical history, past surgical history, family history and social history were all reviewed and documented in the EPIC chart. 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  .  ROS:  A ROS was performed and pertinent positives and negatives are included.  Exam:  Vitals:   02/18/17 1031  BP: 110/70  Weight: 110 lb (49.9 kg)  Height: 5\' 1"  (1.549 m)   Body mass index is 20.78 kg/m.   General appearance:  Normal Thyroid:  Symmetrical, normal in size, without palpable masses or nodularity. Respiratory  Auscultation:  Clear without wheezing or rhonchi Cardiovascular  Auscultation:  Regular rate, without rubs, murmurs or gallops  Edema/varicosities:  Not grossly evident Abdominal  Soft,nontender, without masses, guarding or rebound.  Liver/spleen:  No organomegaly noted  Hernia:  None appreciated  Skin  Inspection:  Grossly normal   Breasts: Examined lying and sitting.     Right: Without masses, retractions, discharge or axillary adenopathy.     Left: Without masses, retractions, discharge or axillary adenopathy. Gentitourinary   Inguinal/mons:  Normal without inguinal adenopathy  External genitalia:  Normal  BUS/Urethra/Skene's glands:  Normal  Vagina:  Normal  Cervix:  And  uterus absent Adnexa/parametria:     Rt: Without masses or tenderness.   Lt: Without masses or tenderness.  Anus and perineum: Normal  Digital rectal exam: Normal sphincter tone without palpated masses or tenderness  Assessment/Plan:  56 y.o. SAF G0 for annual exam.     TVH on estradiol with good relief of menopausal symptoms Osteopenia without elevated FRAX  Plan: Estradiol 0.5 mg by mouth daily prescription,  proper use, slight risk for blood clots strokes, and breast cancer reviewed. SBEs, continue annual 3-D screening mammogram history of dense breasts. Continue active lifestyle with regular exercise, yoga. Has had some left shoulder and bilateral wrist pain encouraged to modify yoga poses and to rest joints. Home safety, fall prevention discussed. Continue vitamin D supplement last vitamin D level L. Reviewed importance of screening colonoscopy [facility] information given instructed to schedule. CBC, lipid panel, CMP,    Huel Cote [facility], 12:58 PM 02/18/2017

## 2017-02-18 NOTE — Patient Instructions (Signed)
Dr Collene Mares [phone] Dr [facility]   [phone]  Health Maintenance for Postmenopausal Women Menopause is a normal process in which your reproductive ability comes to an end. This process happens gradually over a span of months to years, usually between the ages of 20 and 41. Menopause is complete when you have missed 12 consecutive menstrual periods. It is important to talk with your health care provider about some of the most common conditions that affect postmenopausal women, such as heart disease, cancer, and bone loss (osteoporosis). Adopting a healthy lifestyle and getting preventive care can help to promote your health and wellness. Those actions can also lower your chances of developing some of these common conditions. What should I know about menopause? During menopause, you may experience a number of symptoms, such as:  Moderate-to-severe hot flashes.  Night sweats.  Decrease in sex drive.  Mood swings.  Headaches.  Tiredness.  Irritability.  Memory problems.  Insomnia. Choosing to treat or not to treat menopausal changes is an individual decision that you make with your health care provider. What should I know about hormone replacement therapy and supplements? Hormone therapy products are effective for treating symptoms that are associated with menopause, such as hot flashes and night sweats. Hormone replacement carries certain risks, especially as you become older. If you are thinking about using estrogen or estrogen with progestin treatments, discuss the benefits and risks with your health care provider. What should I know about heart disease and stroke? Heart disease, heart attack, and stroke become more likely as you age. This may be due, in part, to the hormonal changes that your body experiences during menopause. These can affect how your body processes dietary fats, triglycerides, and cholesterol. Heart attack and stroke are both medical emergencies. There are  many things that you can do to help prevent heart disease and stroke:  Have your blood pressure checked at least every 1-2 years. High blood pressure causes heart disease and increases the risk of stroke.  If you are 14-24 years old, ask your health care provider if you should take aspirin to prevent a heart attack or a stroke.  Do not use any tobacco products, including cigarettes, chewing tobacco, or electronic cigarettes. If you need help quitting, ask your health care provider.  It is important to eat a healthy diet and maintain a healthy weight.  Be sure to include plenty of vegetables, fruits, low-fat dairy products, and lean protein.  Avoid eating foods that are high in solid fats, added sugars, or salt (sodium).  Get regular exercise. This is one of the most important things that you can do for your health.  Try to exercise for at least 150 minutes each week. The type of exercise that you do should increase your heart rate and make you sweat. This is known as moderate-intensity exercise.  Try to do strengthening exercises at least twice each week. Do these in addition to the moderate-intensity exercise.  Know your numbers.Ask your health care provider to check your cholesterol and your blood glucose. Continue to have your blood tested as directed by your health care provider. What should I know about cancer screening? There are several types of cancer. Take the following steps to reduce your risk and to catch any cancer development as early as possible. Breast Cancer  Practice breast self-awareness.  This means understanding how your breasts normally appear and feel.  It also means doing regular breast self-exams. Let your health care provider know about any  changes, no matter how small.  If you are 40 or older, have a clinician do a breast exam (clinical breast exam or CBE) every year. Depending on your age, family history, and medical history, it may be recommended that you  also have a yearly breast X-ray (mammogram).  If you have a family history of breast cancer, talk with your health care provider about genetic screening.  If you are at high risk for breast cancer, talk with your health care provider about having an MRI and a mammogram every year.  Breast cancer (BRCA) gene test is recommended for women who have family members with BRCA-related cancers. Results of the assessment will determine the need for genetic counseling and BRCA1 and for BRCA2 testing. BRCA-related cancers include these types:  Breast. This occurs in males or females.  Ovarian.  Tubal. This may also be called fallopian tube cancer.  Cancer of the abdominal or pelvic lining (peritoneal cancer).  Prostate.  Pancreatic. Cervical, Uterine, and Ovarian Cancer  Your health care provider may recommend that you be screened regularly for cancer of the pelvic organs. These include your ovaries, uterus, and vagina. This screening involves a pelvic exam, which includes checking for microscopic changes to the surface of your cervix (Pap test).  For women ages 21-65, health care providers may recommend a pelvic exam and a Pap test every three years. For women ages 65-65, they may recommend the Pap test and pelvic exam, combined with testing for human papilloma virus (HPV), every five years. Some types of HPV increase your risk of cervical cancer. Testing for HPV may also be done on women of any age who have unclear Pap test results.  Other health care providers may not recommend any screening for nonpregnant women who are considered low risk for pelvic cancer and have no symptoms. Ask your health care provider if a screening pelvic exam is right for you.  If you have had past treatment for cervical cancer or a condition that could lead to cancer, you need Pap tests and screening for cancer for at least 20 years after your treatment. If Pap tests have been discontinued for you, your risk factors  (such as having a new sexual partner) need to be reassessed to determine if you should start having screenings again. Some women have medical problems that increase the chance of getting cervical cancer. In these cases, your health care provider may recommend that you have screening and Pap tests more often.  If you have a family history of uterine cancer or ovarian cancer, talk with your health care provider about genetic screening.  If you have vaginal bleeding after reaching menopause, tell your health care provider.  There are currently no reliable tests available to screen for ovarian cancer. Lung Cancer  Lung cancer screening is recommended for adults 12-61 years old who are at high risk for lung cancer because of a history of smoking. A yearly low-dose CT scan of the lungs is recommended if you:  Currently smoke.  Have a history of at least 30 pack-years of smoking and you currently smoke or have quit within the past 15 years. A pack-year is smoking an average of one pack of cigarettes per day for one year. Yearly screening should:  Continue until it has been 15 years since you quit.  Stop if you develop a health problem that would prevent you from having lung cancer treatment. Colorectal Cancer  This type of cancer can be detected and can often be prevented.  Routine colorectal cancer screening usually begins at age 69 and continues through age 67.  If you have risk factors for colon cancer, your health care provider may recommend that you be screened at an earlier age.  If you have a family history of colorectal cancer, talk with your health care provider about genetic screening.  Your health care provider may also recommend using home test kits to check for hidden blood in your stool.  A small camera at the end of a tube can be used to examine your colon directly (sigmoidoscopy or colonoscopy). This is done to check for the earliest forms of colorectal cancer.  Direct  examination of the colon should be repeated every 5-10 years until age 61. However, if early forms of precancerous polyps or small growths are found or if you have a family history or genetic risk for colorectal cancer, you may need to be screened more often. Skin Cancer  Check your skin from head to toe regularly.  Monitor any moles. Be sure to tell your health care provider:  About any new moles or changes in moles, especially if there is a change in a mole's shape or color.  If you have a mole that is larger than the size of a pencil eraser.  If any of your family members has a history of skin cancer, especially at a young age, talk with your health care provider about genetic screening.  Always use sunscreen. Apply sunscreen liberally and repeatedly throughout the day.  Whenever you are outside, protect yourself by wearing long sleeves, pants, a wide-brimmed hat, and sunglasses. What should I know about osteoporosis? Osteoporosis is a condition in which bone destruction happens more quickly than new bone creation. After menopause, you may be at an increased risk for osteoporosis. To help prevent osteoporosis or the bone fractures that can happen because of osteoporosis, the following is recommended:  If you are 39-26 years old, get at least 1,000 mg of calcium and at least 600 mg of vitamin D per day.  If you are older than age 40 but younger than age 62, get at least 1,200 mg of calcium and at least 600 mg of vitamin D per day.  If you are older than age 58, get at least 1,200 mg of calcium and at least 800 mg of vitamin D per day. Smoking and excessive alcohol intake increase the risk of osteoporosis. Eat foods that are rich in calcium and vitamin D, and do weight-bearing exercises several times each week as directed by your health care provider. What should I know about how menopause affects my mental health? Depression may occur at any age, but it is more common as you become older.  Common symptoms of depression include:  Low or sad mood.  Changes in sleep patterns.  Changes in appetite or eating patterns.  Feeling an overall lack of motivation or enjoyment of activities that you previously enjoyed.  Frequent crying spells. Talk with your health care provider if you think that you are experiencing depression. What should I know about immunizations? It is important that you get and maintain your immunizations. These include:  Tetanus, diphtheria, and pertussis (Tdap) booster vaccine.  Influenza every year before the flu season begins.  Pneumonia vaccine.  Shingles vaccine. Your health care provider may also recommend other immunizations. This information is not intended to replace advice given to you by your health care provider. Make sure you discuss any questions you have with your health care provider. Document  Released: 01/24/2006 Document Revised: 06/21/2016 Document Reviewed: 09/05/2015 Elsevier Interactive Patient Education  2017 [employer].

## 2017-02-19 ENCOUNTER — Encounter: Payer: Self-pay | Admitting: Women's Health

## 2017-04-04 ENCOUNTER — Encounter: Payer: Self-pay | Admitting: Gynecology

## 2017-04-25 ENCOUNTER — Other Ambulatory Visit: Payer: Self-pay | Admitting: Women's Health

## 2017-04-25 DIAGNOSIS — Z79890 Hormone replacement therapy (postmenopausal): Secondary | ICD-10-CM

## 2017-04-30 ENCOUNTER — Encounter: Payer: Self-pay | Admitting: Gynecology

## 2017-10-04 IMAGING — CR DG ELBOW 2V*R*
2 series · 2 of 2 positions shown · non-contrast
Comparison: None in PACs

CLINICAL DATA: Lateral elbow pain for the past 2 months without
specific injury; the patient has sustained multiple contusions over
time from [employer] practice.

EXAM:
RIGHT ELBOW - 2 VIEW

[AP]
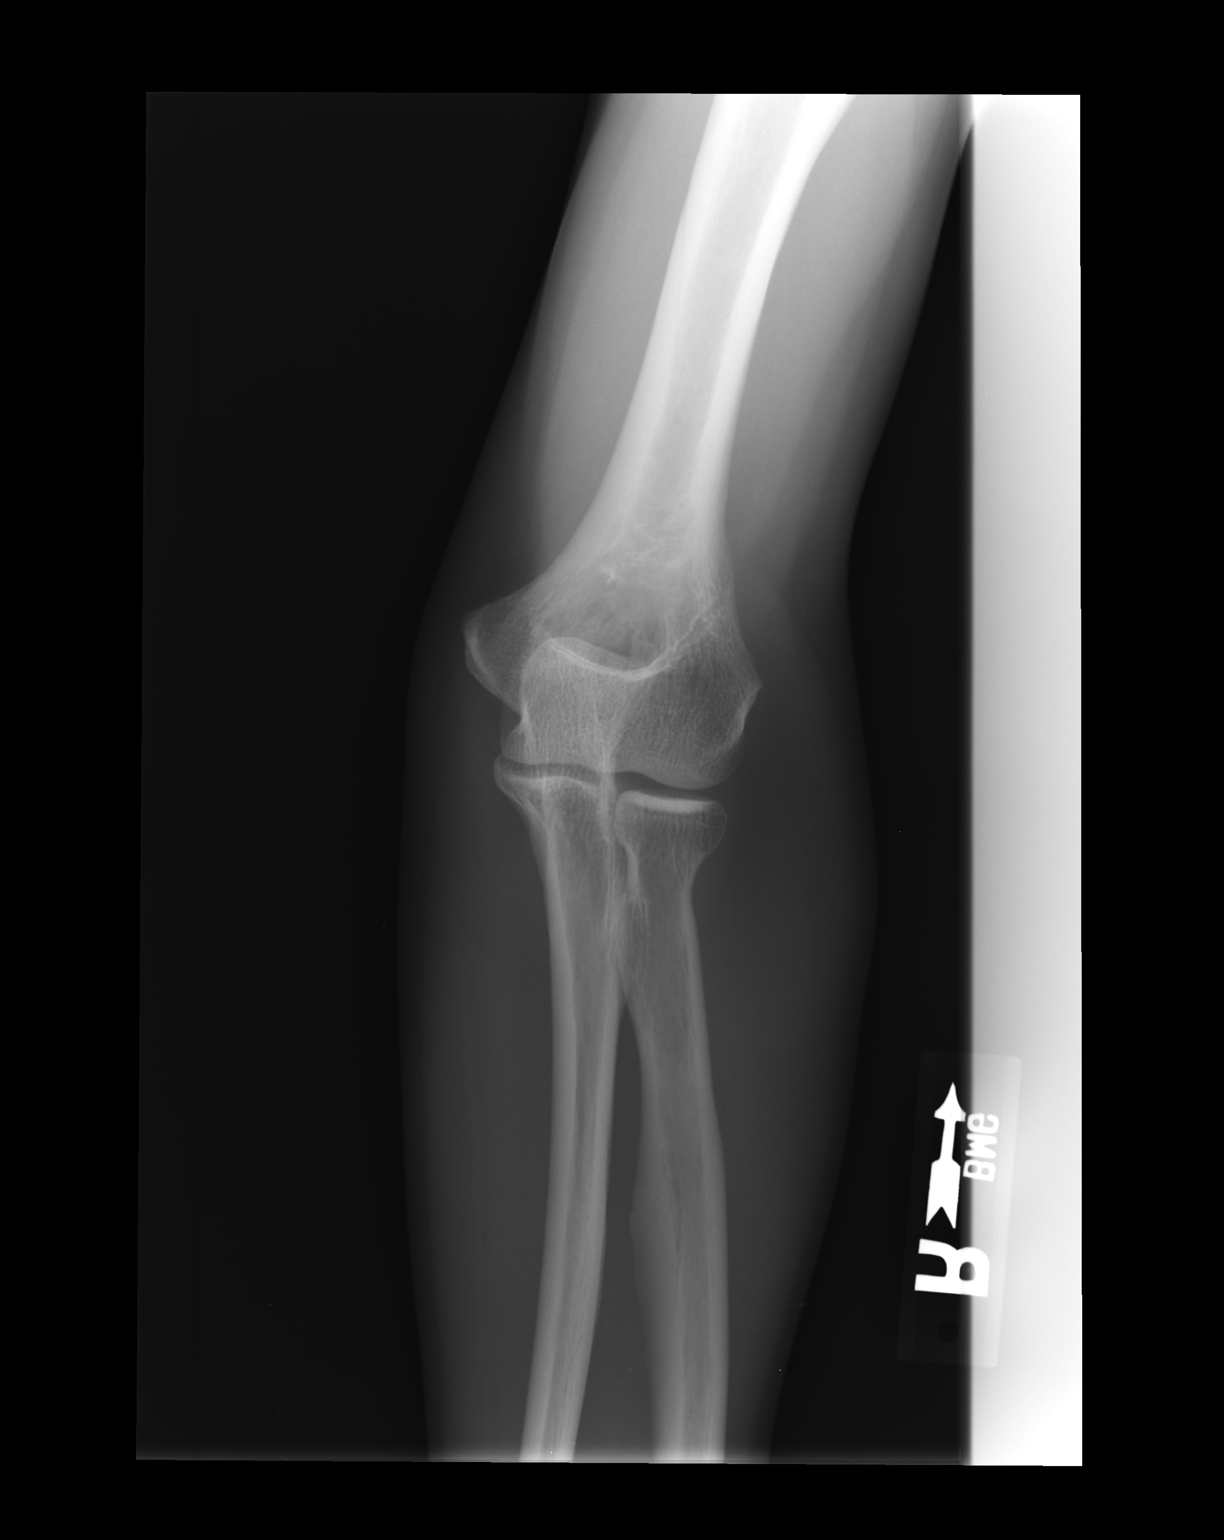

[lateral]
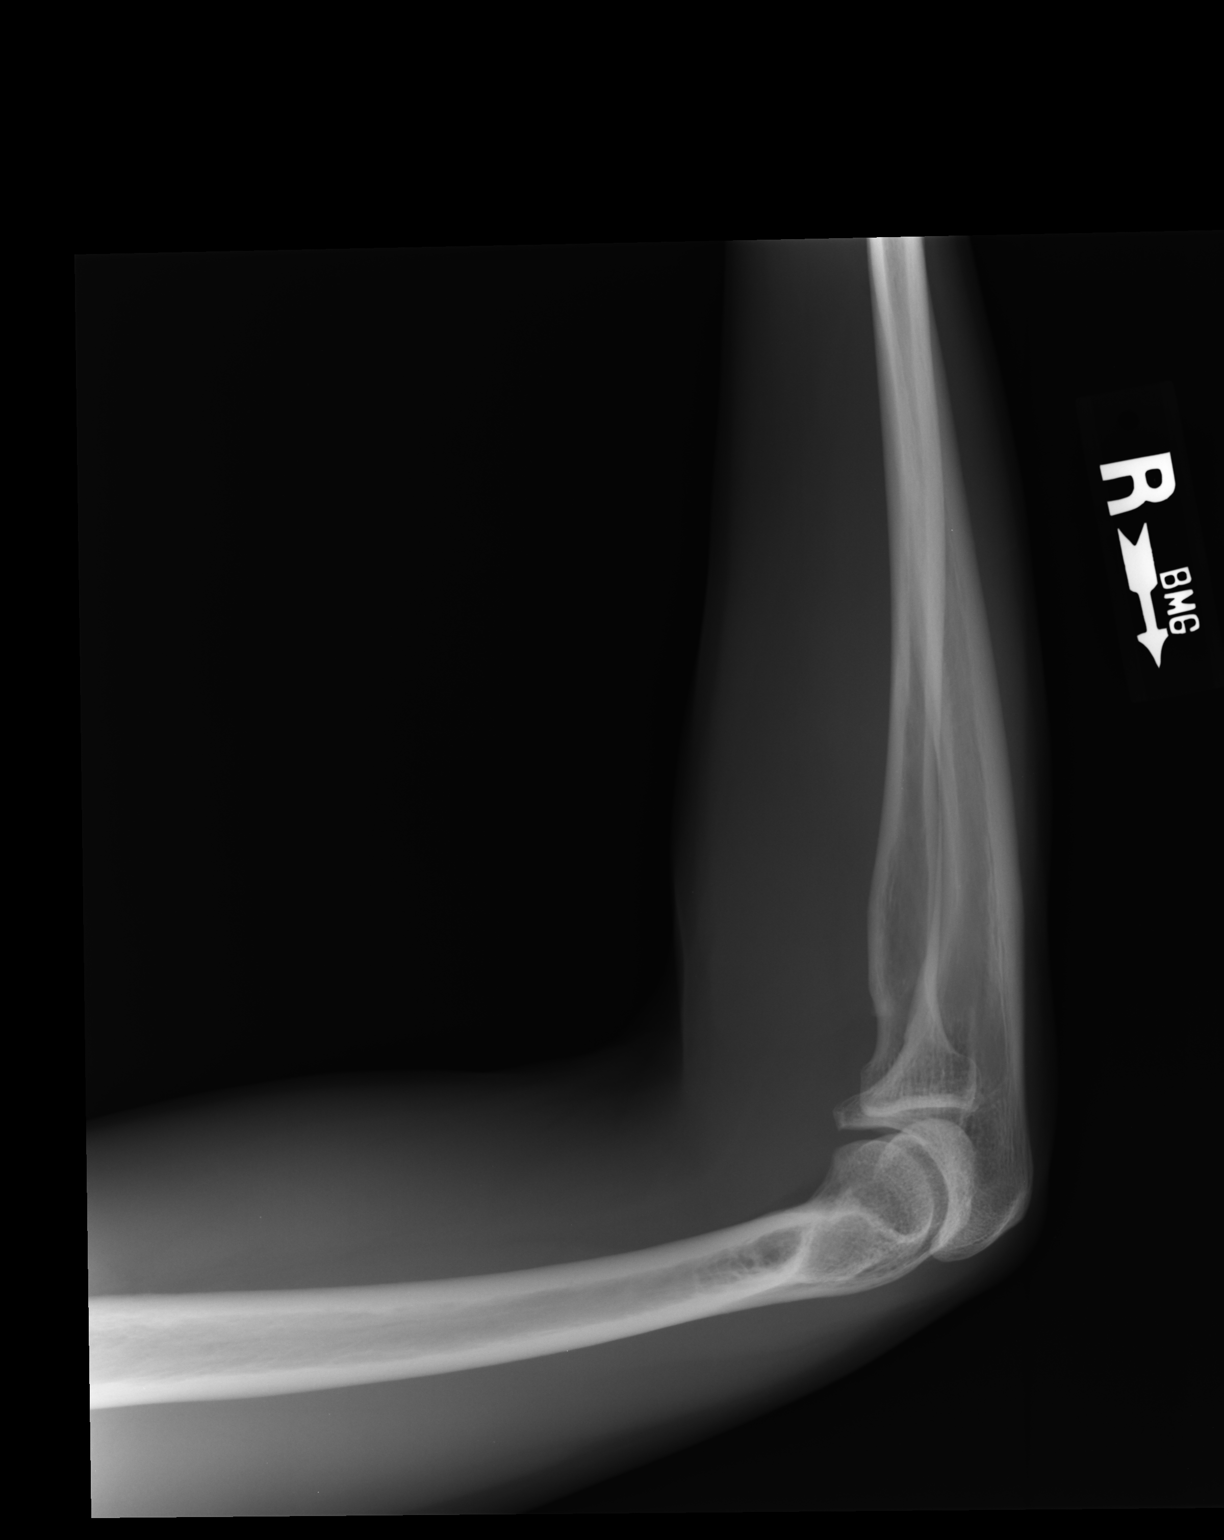

[2 of 2 positions shown; findings below may reference images not displayed]

FINDINGS: AP and lateral views of the elbow reveal the bones to be adequately
mineralized. There is no lytic or blastic lesion. No acute or
healing fracture is observed. There is no joint effusion. There are
no abnormal soft tissue calcifications.
IMPRESSION: There is no acute or chronic bony abnormality of the right elbow.

## 2018-01-03 DIAGNOSIS — M25521 Pain in right elbow: Secondary | ICD-10-CM | POA: Insufficient documentation

## 2018-04-28 ENCOUNTER — Ambulatory Visit: Payer: BC Managed Care – PPO | Admitting: Women's Health

## 2018-04-28 ENCOUNTER — Encounter: Payer: Self-pay | Admitting: Women's Health

## 2018-04-28 VITALS — BP 122/80 | Ht 61.0 in | Wt 106.0 lb

## 2018-04-28 DIAGNOSIS — Z79890 Hormone replacement therapy (postmenopausal): Secondary | ICD-10-CM

## 2018-04-28 DIAGNOSIS — Z01419 Encounter for gynecological examination (general) (routine) without abnormal findings: Secondary | ICD-10-CM | POA: Diagnosis not present

## 2018-04-28 DIAGNOSIS — Z13220 Encounter for screening for lipoid disorders: Secondary | ICD-10-CM

## 2018-04-28 DIAGNOSIS — Z13820 Encounter for screening for osteoporosis: Secondary | ICD-10-CM

## 2018-04-28 LAB — COMPREHENSIVE METABOLIC PANEL
AG RATIO: 1.8 (calc) (ref 1.0–2.5)
ALKALINE PHOSPHATASE (APISO): 41 U/L (ref 33–130)
ALT: 18 U/L (ref 6–29)
AST: 22 U/L (ref 10–35)
Albumin: 4.4 g/dL (ref 3.6–5.1)
BILIRUBIN TOTAL: 0.5 mg/dL (ref 0.2–1.2)
BUN: 12 mg/dL (ref 7–25)
CHLORIDE: 102 mmol/L (ref 98–110)
CO2: 30 mmol/L (ref 20–32)
Calcium: 9.5 mg/dL (ref 8.6–10.4)
Creat: 0.83 mg/dL (ref 0.50–1.05)
GLOBULIN: 2.4 g/dL (ref 1.9–3.7)
Glucose, Bld: 68 mg/dL (ref 65–99)
POTASSIUM: 4 mmol/L (ref 3.5–5.3)
Sodium: 140 mmol/L (ref 135–146)
Total Protein: 6.8 g/dL (ref 6.1–8.1)

## 2018-04-28 LAB — CBC WITH DIFFERENTIAL/PLATELET
BASOS ABS: 38 {cells}/uL (ref 0–200)
BASOS PCT: 0.9 %
EOS ABS: 139 {cells}/uL (ref 15–500)
Eosinophils Relative: 3.3 %
HCT: 39.9 % (ref 35.0–45.0)
HEMOGLOBIN: 13.7 g/dL (ref 11.7–15.5)
Lymphs Abs: 1394 cells/uL (ref 850–3900)
MCH: 31.8 pg (ref 27.0–33.0)
MCHC: 34.3 g/dL (ref 32.0–36.0)
MCV: 92.6 fL (ref 80.0–100.0)
MPV: 10.8 fL (ref 7.5–12.5)
Monocytes Relative: 8.1 %
NEUTROS ABS: 2289 {cells}/uL (ref 1500–7800)
Neutrophils Relative %: 54.5 %
Platelets: 228 10*3/uL (ref 140–400)
RBC: 4.31 10*6/uL (ref 3.80–5.10)
RDW: 12.4 % (ref 11.0–15.0)
Total Lymphocyte: 33.2 %
WBC: 4.2 10*3/uL (ref 3.8–10.8)
WBCMIX: 340 {cells}/uL (ref 200–950)

## 2018-04-28 LAB — LIPID PANEL
Cholesterol: 201 mg/dL — ABNORMAL HIGH (ref <200)
HDL: 86 mg/dL (ref >50)
LDL CHOLESTEROL (CALC): 95 mg/dL
Non-HDL Cholesterol (Calc): 115 mg/dL (calc) (ref <130)
Total CHOL/HDL Ratio: 2.3 (calc) (ref <5.0)
Triglycerides: 107 mg/dL (ref <150)

## 2018-04-28 MED ORDER — ESTRADIOL 0.5 MG PO TABS: 0.5000 mg | ORAL_TABLET | Freq: Every day | ORAL | 3 refills | Status: DC

## 2018-04-28 NOTE — Progress Notes (Signed)
Daila Elbert 1960/12/28 [id number]    History:    Presents for annual exam.  2012 TVH for menorrhagia and fibroids on estradiol with good relief of menopausal symptoms.  2017 T score -1.2 FRAX 4.6% / 0.3%.  Normal Pap and mammogram history.  2018- colonoscopy.  Past medical history, past surgical history, family history and social history were all reviewed and documented in the EPIC chart.  [employer].  Does yoga and has cats.  ROS:  A ROS was performed and pertinent positives and negatives are included.  Exam:  Vitals:   04/28/18 1059  BP: 122/80  Weight: 106 lb (48.1 kg)  Height: 5\' 1"  (1.549 m)   Body mass index is 20.03 kg/m.   General appearance:  Normal Thyroid:  Symmetrical, normal in size, without palpable masses or nodularity. Respiratory  Auscultation:  Clear without wheezing or rhonchi Cardiovascular  Auscultation:  Regular rate, without rubs, murmurs or gallops  Edema/varicosities:  Not grossly evident Abdominal  Soft,nontender, without masses, guarding or rebound.  Liver/spleen:  No organomegaly noted  Hernia:  None appreciated  Skin  Inspection:  Grossly normal   Breasts: Examined lying and sitting.     Right: Without masses, retractions, discharge or axillary adenopathy.     Left: Without masses, retractions, discharge or axillary adenopathy. Gentitourinary   Inguinal/mons:  Normal without inguinal adenopathy  External genitalia:  Normal  BUS/Urethra/Skene's glands:  Normal  Vagina:  Normal  Cervix: And uterus absent  Adnexa/parametria:     Rt: Without masses or tenderness.   Lt: Without masses or tenderness.  Anus and perineum: Normal  Digital rectal exam: Normal sphincter tone without palpated masses or tenderness  Assessment/Plan:  57 y.o. SAF G0 for annual exam with no complaints.    2012 TVH on estradiol Osteopenia without elevated FRAX  Plan: Repeat DEXA, will schedule.  Continue healthy lifestyle of regular exercise and  yoga.  Vitamin D 2000 daily encouraged.  Estradiol 0.5 mg p.o. daily prescription, proper use given and reviewed, reviewed best to stay on less than 7 years will continue.  CBC, CMP, lipid panel, Pap screening guidelines reviewed.   [place], 11:46 AM 04/28/2018

## 2018-04-28 NOTE — Patient Instructions (Signed)
Health Maintenance for Postmenopausal Women Menopause is a normal process in which your reproductive ability comes to an end. This process happens gradually over a span of months to years, usually between the ages of 22 and 9. Menopause is complete when you have missed 12 consecutive menstrual periods. It is important to talk with your health care provider about some of the most common conditions that affect postmenopausal women, such as heart disease, cancer, and bone loss (osteoporosis). Adopting a healthy lifestyle and getting preventive care can help to promote your health and wellness. Those actions can also lower your chances of developing some of these common conditions. What should I know about menopause? During menopause, you may experience a number of symptoms, such as:  Moderate-to-severe hot flashes.  Night sweats.  Decrease in sex drive.  Mood swings.  Headaches.  Tiredness.  Irritability.  Memory problems.  Insomnia.  Choosing to treat or not to treat menopausal changes is an individual decision that you make with your health care provider. What should I know about hormone replacement therapy and supplements? Hormone therapy products are effective for treating symptoms that are associated with menopause, such as hot flashes and night sweats. Hormone replacement carries certain risks, especially as you become older. If you are thinking about using estrogen or estrogen with progestin treatments, discuss the benefits and risks with your health care provider. What should I know about heart disease and stroke? Heart disease, heart attack, and stroke become more likely as you age. This may be due, in part, to the hormonal changes that your body experiences during menopause. These can affect how your body processes dietary fats, triglycerides, and cholesterol. Heart attack and stroke are both medical emergencies. There are many things that you can do to help prevent heart disease  and stroke:  Have your blood pressure checked at least every 1-2 years. High blood pressure causes heart disease and increases the risk of stroke.  If you are 53-22 years old, ask your health care provider if you should take aspirin to prevent a heart attack or a stroke.  Do not use any tobacco products, including cigarettes, chewing tobacco, or electronic cigarettes. If you need help quitting, ask your health care provider.  It is important to eat a healthy diet and maintain a healthy weight. ? Be sure to include plenty of vegetables, fruits, low-fat dairy products, and lean protein. ? Avoid eating foods that are high in solid fats, added sugars, or salt (sodium).  Get regular exercise. This is one of the most important things that you can do for your health. ? Try to exercise for at least 150 minutes each week. The type of exercise that you do should increase your heart rate and make you sweat. This is known as moderate-intensity exercise. ? Try to do strengthening exercises at least twice each week. Do these in addition to the moderate-intensity exercise.  Know your numbers.Ask your health care provider to check your cholesterol and your blood glucose. Continue to have your blood tested as directed by your health care provider.  What should I know about cancer screening? There are several types of cancer. Take the following steps to reduce your risk and to catch any cancer development as early as possible. Breast Cancer  Practice breast self-awareness. ? This means understanding how your breasts normally appear and feel. ? It also means doing regular breast self-exams. Let your health care provider know about any changes, no matter how small.  If you are 40  or older, have a clinician do a breast exam (clinical breast exam or CBE) every year. Depending on your age, family history, and medical history, it may be recommended that you also have a yearly breast X-ray (mammogram).  If you  have a family history of breast cancer, talk with your health care provider about genetic screening.  If you are at high risk for breast cancer, talk with your health care provider about having an MRI and a mammogram every year.  Breast cancer (BRCA) gene test is recommended for women who have family members with BRCA-related cancers. Results of the assessment will determine the need for genetic counseling and BRCA1 and for BRCA2 testing. BRCA-related cancers include these types: ? Breast. This occurs in males or females. ? Ovarian. ? Tubal. This may also be called fallopian tube cancer. ? Cancer of the abdominal or pelvic lining (peritoneal cancer). ? Prostate. ? Pancreatic.  Cervical, Uterine, and Ovarian Cancer Your health care provider may recommend that you be screened regularly for cancer of the pelvic organs. These include your ovaries, uterus, and vagina. This screening involves a pelvic exam, which includes checking for microscopic changes to the surface of your cervix (Pap test).  For women ages 21-65, health care providers may recommend a pelvic exam and a Pap test every three years. For women ages 79-65, they may recommend the Pap test and pelvic exam, combined with testing for human papilloma virus (HPV), every five years. Some types of HPV increase your risk of cervical cancer. Testing for HPV may also be done on women of any age who have unclear Pap test results.  Other health care providers may not recommend any screening for nonpregnant women who are considered low risk for pelvic cancer and have no symptoms. Ask your health care provider if a screening pelvic exam is right for you.  If you have had past treatment for cervical cancer or a condition that could lead to cancer, you need Pap tests and screening for cancer for at least 20 years after your treatment. If Pap tests have been discontinued for you, your risk factors (such as having a new sexual partner) need to be  reassessed to determine if you should start having screenings again. Some women have medical problems that increase the chance of getting cervical cancer. In these cases, your health care provider may recommend that you have screening and Pap tests more often.  If you have a family history of uterine cancer or ovarian cancer, talk with your health care provider about genetic screening.  If you have vaginal bleeding after reaching menopause, tell your health care provider.  There are currently no reliable tests available to screen for ovarian cancer.  Lung Cancer Lung cancer screening is recommended for adults 69-62 years old who are at high risk for lung cancer because of a history of smoking. A yearly low-dose CT scan of the lungs is recommended if you:  Currently smoke.  Have a history of at least 30 pack-years of smoking and you currently smoke or have quit within the past 15 years. A pack-year is smoking an average of one pack of cigarettes per day for one year.  Yearly screening should:  Continue until it has been 15 years since you quit.  Stop if you develop a health problem that would prevent you from having lung cancer treatment.  Colorectal Cancer  This type of cancer can be detected and can often be prevented.  Routine colorectal cancer screening usually begins at  age 42 and continues through age 45.  If you have risk factors for colon cancer, your health care provider may recommend that you be screened at an earlier age.  If you have a family history of colorectal cancer, talk with your health care provider about genetic screening.  Your health care provider may also recommend using home test kits to check for hidden blood in your stool.  A small camera at the end of a tube can be used to examine your colon directly (sigmoidoscopy or colonoscopy). This is done to check for the earliest forms of colorectal cancer.  Direct examination of the colon should be repeated every  5-10 years until age 71. However, if early forms of precancerous polyps or small growths are found or if you have a family history or genetic risk for colorectal cancer, you may need to be screened more often.  Skin Cancer  Check your skin from head to toe regularly.  Monitor any moles. Be sure to tell your health care provider: ? About any new moles or changes in moles, especially if there is a change in a mole's shape or color. ? If you have a mole that is larger than the size of a pencil eraser.  If any of your family members has a history of skin cancer, especially at a young age, talk with your health care provider about genetic screening.  Always use sunscreen. Apply sunscreen liberally and repeatedly throughout the day.  Whenever you are outside, protect yourself by wearing long sleeves, pants, a wide-brimmed hat, and sunglasses.  What should I know about osteoporosis? Osteoporosis is a condition in which bone destruction happens more quickly than new bone creation. After menopause, you may be at an increased risk for osteoporosis. To help prevent osteoporosis or the bone fractures that can happen because of osteoporosis, the following is recommended:  If you are 46-71 years old, get at least 1,000 mg of calcium and at least 600 mg of vitamin D per day.  If you are older than age 55 but younger than age 65, get at least 1,200 mg of calcium and at least 600 mg of vitamin D per day.  If you are older than age 54, get at least 1,200 mg of calcium and at least 800 mg of vitamin D per day.  Smoking and excessive alcohol intake increase the risk of osteoporosis. Eat foods that are rich in calcium and vitamin D, and do weight-bearing exercises several times each week as directed by your health care provider. What should I know about how menopause affects my mental health? Depression may occur at any age, but it is more common as you become older. Common symptoms of depression  include:  Low or sad mood.  Changes in sleep patterns.  Changes in appetite or eating patterns.  Feeling an overall lack of motivation or enjoyment of activities that you previously enjoyed.  Frequent crying spells.  Talk with your health care provider if you think that you are experiencing depression. What should I know about immunizations? It is important that you get and maintain your immunizations. These include:  Tetanus, diphtheria, and pertussis (Tdap) booster vaccine.  Influenza every year before the flu season begins.  Pneumonia vaccine.  Shingles vaccine.  Your health care provider may also recommend other immunizations. This information is not intended to replace advice given to you by your health care provider. Make sure you discuss any questions you have with your health care provider. Document Released: 01/24/2006  Document Revised: 06/21/2016 Document Reviewed: 09/05/2015 Elsevier Interactive Patient Education  2018 [employer]  

## 2018-04-29 ENCOUNTER — Encounter (INDEPENDENT_AMBULATORY_CARE_PROVIDER_SITE_OTHER): Payer: Self-pay

## 2019-05-03 ENCOUNTER — Other Ambulatory Visit: Payer: Self-pay | Admitting: Women's Health

## 2019-05-03 DIAGNOSIS — Z79890 Hormone replacement therapy (postmenopausal): Secondary | ICD-10-CM

## 2019-05-25 ENCOUNTER — Encounter: Payer: BC Managed Care – PPO | Admitting: Women's Health

## 2019-05-31 ENCOUNTER — Other Ambulatory Visit: Payer: Self-pay

## 2019-06-01 ENCOUNTER — Encounter: Payer: Self-pay | Admitting: Women's Health

## 2019-06-01 ENCOUNTER — Ambulatory Visit: Payer: BC Managed Care – PPO | Admitting: Women's Health

## 2019-06-01 VITALS — BP 118/80 | Ht 61.0 in | Wt 106.0 lb

## 2019-06-01 DIAGNOSIS — Z01419 Encounter for gynecological examination (general) (routine) without abnormal findings: Secondary | ICD-10-CM

## 2019-06-01 DIAGNOSIS — E559 Vitamin D deficiency, unspecified: Secondary | ICD-10-CM | POA: Diagnosis not present

## 2019-06-01 DIAGNOSIS — Z13820 Encounter for screening for osteoporosis: Secondary | ICD-10-CM | POA: Diagnosis not present

## 2019-06-01 DIAGNOSIS — Z79890 Hormone replacement therapy (postmenopausal): Secondary | ICD-10-CM

## 2019-06-01 MED ORDER — ESTRADIOL 0.5 MG PO TABS: 0.5000 mg | ORAL_TABLET | Freq: Every day | ORAL | 4 refills | Status: DC

## 2019-06-01 NOTE — Progress Notes (Signed)
Bethany Watts 11-16-1961 [id number]    History:    Presents for annual exam.  2012 TVH for fibroids and menorrhagia on estradiol.  Normal Pap and mammogram history.  2017 T score -1.2 FRAX 4.6% / 0.3%.  2018- colonoscopy.  Same partner, rare intercourse.  Past medical history, past surgical history, family history and social history were all reviewed and documented in the EPIC chart.  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  Has 3 cats.  ROS:  A ROS was performed and pertinent positives and negatives are included.  Exam:  Vitals:   06/01/19 1408  BP: 118/80  Weight: 106 lb (48.1 kg)  Height: 5\' 1"  (1.549 m)   Body mass index is 20.03 kg/m.   General appearance:  Normal Thyroid:  Symmetrical, normal in size, without palpable masses or nodularity. Respiratory  Auscultation:  Clear without wheezing or rhonchi Cardiovascular  Auscultation:  Regular rate, without rubs, murmurs or gallops  Edema/varicosities:  Not grossly evident Abdominal  Soft,nontender, without masses, guarding or rebound.  Liver/spleen:  No organomegaly noted  Hernia:  None appreciated  Skin  Inspection:  Grossly normal   Breasts: Examined lying and sitting.     Right: Without masses, retractions, discharge or axillary adenopathy.     Left: Without masses, retractions, discharge or axillary adenopathy. Gentitourinary   Inguinal/mons:  Normal without inguinal adenopathy  External genitalia:  Normal  BUS/Urethra/Skene's glands:  Normal  Vagina:  Normal  Cervix: And uterus absent  Adnexa/parametria:     Rt: Without masses or tenderness.   Lt: Without masses or tenderness.  Anus and perineum: Normal  Digital rectal exam: Normal sphincter tone without palpated masses or tenderness  Assessment/Plan:  58 y.o. [name] G0 for annual exam with no complaints.  2012 TVH for fibroids and menorrhagia on estradiol Osteopenia without elevated FRAX  Plan: Repeat DEXA, continue healthy lifestyle of regular exercise, yoga.  Home  safety, fall prevention discussed.  History of a fractured ankle from a fall.  SBEs, annual screening mammogram due instructed to schedule.  Vitamin D 2000 daily calcium rich foods encouraged.  CBC, CMP, vitamin D, normal lipid panel last year will come fasting next year for repeat screen.    Bethany Watts Bethany Watts, 3:08 PM 06/01/2019

## 2019-06-01 NOTE — Patient Instructions (Signed)
Health Maintenance for Postmenopausal Women Menopause is a normal process in which your reproductive ability comes to an end. This process happens gradually over a span of months to years, usually between the ages of 62 and 89. Menopause is complete when you have missed 12 consecutive menstrual periods. It is important to talk with your health care provider about some of the most common conditions that affect postmenopausal women, such as heart disease, cancer, and bone loss (osteoporosis). Adopting a healthy lifestyle and getting preventive care can help to promote your health and wellness. Those actions can also lower your chances of developing some of these common conditions. What should I know about menopause? During menopause, you may experience a number of symptoms, such as:  Moderate-to-severe hot flashes.  Night sweats.  Decrease in sex drive.  Mood swings.  Headaches.  Tiredness.  Irritability.  Memory problems.  Insomnia. Choosing to treat or not to treat menopausal changes is an individual decision that you make with your health care provider. What should I know about hormone replacement therapy and supplements? Hormone therapy products are effective for treating symptoms that are associated with menopause, such as hot flashes and night sweats. Hormone replacement carries certain risks, especially as you become older. If you are thinking about using estrogen or estrogen with progestin treatments, discuss the benefits and risks with your health care provider. What should I know about heart disease and stroke? Heart disease, heart attack, and stroke become more likely as you age. This may be due, in part, to the hormonal changes that your body experiences during menopause. These can affect how your body processes dietary fats, triglycerides, and cholesterol. Heart attack and stroke are both medical emergencies. There are many things that you can do to help prevent heart disease  and stroke:  Have your blood pressure checked at least every 1-2 years. High blood pressure causes heart disease and increases the risk of stroke.  If you are 79-72 years old, ask your health care provider if you should take aspirin to prevent a heart attack or a stroke.  Do not use any tobacco products, including cigarettes, chewing tobacco, or electronic cigarettes. If you need help quitting, ask your health care provider.  It is important to eat a healthy diet and maintain a healthy weight. ? Be sure to include plenty of vegetables, fruits, low-fat dairy products, and lean protein. ? Avoid eating foods that are high in solid fats, added sugars, or salt (sodium).  Get regular exercise. This is one of the most important things that you can do for your health. ? Try to exercise for at least 150 minutes each week. The type of exercise that you do should increase your heart rate and make you sweat. This is known as moderate-intensity exercise. ? Try to do strengthening exercises at least twice each week. Do these in addition to the moderate-intensity exercise.  Know your numbers.Ask your health care provider to check your cholesterol and your blood glucose. Continue to have your blood tested as directed by your health care provider.  What should I know about cancer screening? There are several types of cancer. Take the following steps to reduce your risk and to catch any cancer development as early as possible. Breast Cancer  Practice breast self-awareness. ? This means understanding how your breasts normally appear and feel. ? It also means doing regular breast self-exams. Let your health care provider know about any changes, no matter how small.  If you are 40 or  older, have a clinician do a breast exam (clinical breast exam or CBE) every year. Depending on your age, family history, and medical history, it may be recommended that you also have a yearly breast X-ray (mammogram).  If you  have a family history of breast cancer, talk with your health care provider about genetic screening.  If you are at high risk for breast cancer, talk with your health care provider about having an MRI and a mammogram every year.  Breast cancer (BRCA) gene test is recommended for women who have family members with BRCA-related cancers. Results of the assessment will determine the need for genetic counseling and BRCA1 and for BRCA2 testing. BRCA-related cancers include these types: ? Breast. This occurs in males or females. ? Ovarian. ? Tubal. This may also be called fallopian tube cancer. ? Cancer of the abdominal or pelvic lining (peritoneal cancer). ? Prostate. ? Pancreatic. Cervical, Uterine, and Ovarian Cancer Your health care provider may recommend that you be screened regularly for cancer of the pelvic organs. These include your ovaries, uterus, and vagina. This screening involves a pelvic exam, which includes checking for microscopic changes to the surface of your cervix (Pap test).  For women ages 21-65, health care providers may recommend a pelvic exam and a Pap test every three years. For women ages 66-65, they may recommend the Pap test and pelvic exam, combined with testing for human papilloma virus (HPV), every five years. Some types of HPV increase your risk of cervical cancer. Testing for HPV may also be done on women of any age who have unclear Pap test results.  Other health care providers may not recommend any screening for nonpregnant women who are considered low risk for pelvic cancer and have no symptoms. Ask your health care provider if a screening pelvic exam is right for you.  If you have had past treatment for cervical cancer or a condition that could lead to cancer, you need Pap tests and screening for cancer for at least 20 years after your treatment. If Pap tests have been discontinued for you, your risk factors (such as having a new sexual partner) need to be reassessed  to determine if you should start having screenings again. Some women have medical problems that increase the chance of getting cervical cancer. In these cases, your health care provider may recommend that you have screening and Pap tests more often.  If you have a family history of uterine cancer or ovarian cancer, talk with your health care provider about genetic screening.  If you have vaginal bleeding after reaching menopause, tell your health care provider.  There are currently no reliable tests available to screen for ovarian cancer. Lung Cancer Lung cancer screening is recommended for adults 60-3 years old who are at high risk for lung cancer because of a history of smoking. A yearly low-dose CT scan of the lungs is recommended if you:  Currently smoke.  Have a history of at least 30 pack-years of smoking and you currently smoke or have quit within the past 15 years. A pack-year is smoking an average of one pack of cigarettes per day for one year. Yearly screening should:  Continue until it has been 15 years since you quit.  Stop if you develop a health problem that would prevent you from having lung cancer treatment. Colorectal Cancer  This type of cancer can be detected and can often be prevented.  Routine colorectal cancer screening usually begins at age 31 and continues through  age 63.  If you have risk factors for colon cancer, your health care provider may recommend that you be screened at an earlier age.  If you have a family history of colorectal cancer, talk with your health care provider about genetic screening.  Your health care provider may also recommend using home test kits to check for hidden blood in your stool.  A small camera at the end of a tube can be used to examine your colon directly (sigmoidoscopy or colonoscopy). This is done to check for the earliest forms of colorectal cancer.  Direct examination of the colon should be repeated every 5-10 years until  age 75. However, if early forms of precancerous polyps or small growths are found or if you have a family history or genetic risk for colorectal cancer, you may need to be screened more often. Skin Cancer  Check your skin from head to toe regularly.  Monitor any moles. Be sure to tell your health care provider: ? About any new moles or changes in moles, especially if there is a change in a mole's shape or color. ? If you have a mole that is larger than the size of a pencil eraser.  If any of your family members has a history of skin cancer, especially at a young age, talk with your health care provider about genetic screening.  Always use sunscreen. Apply sunscreen liberally and repeatedly throughout the day.  Whenever you are outside, protect yourself by wearing long sleeves, pants, a wide-brimmed hat, and sunglasses. What should I know about osteoporosis? Osteoporosis is a condition in which bone destruction happens more quickly than new bone creation. After menopause, you may be at an increased risk for osteoporosis. To help prevent osteoporosis or the bone fractures that can happen because of osteoporosis, the following is recommended:  If you are 59-59 years old, get at least 1,000 mg of calcium and at least 600 mg of vitamin D per day.  If you are older than age 36 but younger than age 32, get at least 1,200 mg of calcium and at least 600 mg of vitamin D per day.  If you are older than age 47, get at least 1,200 mg of calcium and at least 800 mg of vitamin D per day. Smoking and excessive alcohol intake increase the risk of osteoporosis. Eat foods that are rich in calcium and vitamin D, and do weight-bearing exercises several times each week as directed by your health care provider. What should I know about how menopause affects my mental health? Depression may occur at any age, but it is more common as you become older. Common symptoms of depression include:  Low or sad mood.   Changes in sleep patterns.  Changes in appetite or eating patterns.  Feeling an overall lack of motivation or enjoyment of activities that you previously enjoyed.  Frequent crying spells. Talk with your health care provider if you think that you are experiencing depression. What should I know about immunizations? It is important that you get and maintain your immunizations. These include:  Tetanus, diphtheria, and pertussis (Tdap) booster vaccine.  Influenza every year before the flu season begins.  Pneumonia vaccine.  Shingles vaccine. Your health care provider may also recommend other immunizations. This information is not intended to replace advice given to you by your health care provider. Make sure you discuss any questions you have with your health care provider. Document Released: 01/24/2006 Document Revised: 06/21/2016 Document Reviewed: 09/05/2015 Elsevier Interactive Patient Education  2019 [place].

## 2019-06-02 LAB — CBC WITH DIFFERENTIAL/PLATELET
Absolute Monocytes: 357 cells/uL (ref 200–950)
Basophils Absolute: 41 cells/uL (ref 0–200)
Basophils Relative: 1 %
Eosinophils Absolute: 49 cells/uL (ref 15–500)
Eosinophils Relative: 1.2 %
HCT: 42.1 % (ref 35.0–45.0)
Hemoglobin: 14.1 g/dL (ref 11.7–15.5)
Lymphs Abs: 1542 cells/uL (ref 850–3900)
MCH: 31.8 pg (ref 27.0–33.0)
MCHC: 33.5 g/dL (ref 32.0–36.0)
MCV: 95 fL (ref 80.0–100.0)
MPV: 11 fL (ref 7.5–12.5)
Monocytes Relative: 8.7 %
Neutro Abs: 2112 cells/uL (ref 1500–7800)
Neutrophils Relative %: 51.5 %
Platelets: 258 10*3/uL (ref 140–400)
RBC: 4.43 10*6/uL (ref 3.80–5.10)
RDW: 12.5 % (ref 11.0–15.0)
Total Lymphocyte: 37.6 %
WBC: 4.1 10*3/uL (ref 3.8–10.8)

## 2019-06-02 LAB — COMPREHENSIVE METABOLIC PANEL
AG Ratio: 1.9 (calc) (ref 1.0–2.5)
ALT: 14 U/L (ref 6–29)
AST: 18 U/L (ref 10–35)
Albumin: 4.6 g/dL (ref 3.6–5.1)
Alkaline phosphatase (APISO): 39 U/L (ref 37–153)
BUN: 12 mg/dL (ref 7–25)
CO2: 30 mmol/L (ref 20–32)
Calcium: 10.1 mg/dL (ref 8.6–10.4)
Chloride: 102 mmol/L (ref 98–110)
Creat: 0.77 mg/dL (ref 0.50–1.05)
Globulin: 2.4 g/dL (calc) (ref 1.9–3.7)
Glucose, Bld: 73 mg/dL (ref 65–99)
Potassium: 4 mmol/L (ref 3.5–5.3)
Sodium: 141 mmol/L (ref 135–146)
Total Bilirubin: 0.5 mg/dL (ref 0.2–1.2)
Total Protein: 7 g/dL (ref 6.1–8.1)

## 2019-06-02 LAB — VITAMIN D 25 HYDROXY (VIT D DEFICIENCY, FRACTURES): Vit D, 25-Hydroxy: 56 ng/mL (ref 30–100)

## 2020-06-14 ENCOUNTER — Ambulatory Visit (INDEPENDENT_AMBULATORY_CARE_PROVIDER_SITE_OTHER): Payer: BC Managed Care – PPO | Admitting: Nurse Practitioner

## 2020-06-14 ENCOUNTER — Encounter: Payer: Self-pay | Admitting: Nurse Practitioner

## 2020-06-14 ENCOUNTER — Other Ambulatory Visit: Payer: Self-pay

## 2020-06-14 VITALS — BP 120/80 | Ht 61.0 in | Wt 104.0 lb

## 2020-06-14 DIAGNOSIS — Z90710 Acquired absence of both cervix and uterus: Secondary | ICD-10-CM | POA: Diagnosis not present

## 2020-06-14 DIAGNOSIS — M85859 Other specified disorders of bone density and structure, unspecified thigh: Secondary | ICD-10-CM | POA: Insufficient documentation

## 2020-06-14 DIAGNOSIS — M8589 Other specified disorders of bone density and structure, multiple sites: Secondary | ICD-10-CM | POA: Diagnosis not present

## 2020-06-14 DIAGNOSIS — Z13220 Encounter for screening for lipoid disorders: Secondary | ICD-10-CM | POA: Diagnosis not present

## 2020-06-14 DIAGNOSIS — Z01419 Encounter for gynecological examination (general) (routine) without abnormal findings: Secondary | ICD-10-CM

## 2020-06-14 NOTE — Progress Notes (Signed)
   Bethany Watts [birth year] [id number]   History:  59 y.o. SAF G0 presents for annual exam. 2012 TVH for fibroids, Estradiol 0.5 mg daily with good relief of hot flashes. Normal pap and mammogram history. Osteopenia, taking calcium and vitamin D daily. Not sexually active.   Gynecologic History No LMP recorded. Patient has had a hysterectomy.   Last Pap: No longer screening per guidelines and pt request Last mammogram: 08/12/2019. Results were: normal Last colonoscopy: 07/18/2017. Results were: normal Last Dexa: 05/16/2016. Results were: osteopenia t-score -1.2, FRAX 4.6% / 0.3%  Past medical history, past surgical history, family history and social history were all reviewed and documented in the EPIC chart.  ROS:  A ROS was performed and pertinent positives and negatives are included.  Exam:  Vitals:   06/14/20 1432  BP: 120/80  Weight: 104 lb (47.2 kg)  Height: 5\' 1"  (1.549 m)   Body mass index is 19.65 kg/m.  General appearance:  Normal Thyroid:  Symmetrical, normal in size, without palpable masses or nodularity. Respiratory  Auscultation:  Clear without wheezing or rhonchi Cardiovascular  Auscultation:  Regular rate, without rubs, murmurs or gallops  Edema/varicosities:  Not grossly evident Abdominal  Soft,nontender, without masses, guarding or rebound.  Liver/spleen:  No organomegaly noted  Hernia:  None appreciated  Skin  Inspection:  Grossly normal   Breasts: Examined lying and sitting.   Right: Without masses, retractions, discharge or axillary adenopathy.   Left: Without masses, retractions, discharge or axillary adenopathy. Gentitourinary   Inguinal/mons:  Normal without inguinal adenopathy  External genitalia:  Normal  BUS/Urethra/Skene's glands:  Normal  Vagina:  Normal  Cervix:  Absent  Uterus:  Absent  Adnexa/parametria:     Rt: Without masses or tenderness.   Lt: Without masses or tenderness.  Anus and perineum: Normal  Digital rectal exam: Normal  sphincter tone without palpated masses or tenderness  Assessment/Plan:  59 y.o. G0 for annual exam.  Well female exam with routine gynecological exam - Plan: CBC with Differential/Platelet, Comprehensive metabolic panel. Education provided on SBEs, importance of preventative screenings, current guidelines, high calcium diet, regular exercise, and multivitamin daily.   Lipid screening - Plan: Lipid panel  Osteopenia of multiple sites - Plan: DG Bone Density. Overdue. Will schedule soon. Continue calcium + vitamin D supplement, regular exercise to include weightbearing exercises.   History of total vaginal hysterectomy (TVH) - Estradiol 0.5mg  daily with good relief of hot flashes. She is aware of low risks for blood clots, stroke, heart attack, and breast cancer. She wants to half a tablet to see how she does.  Follow up in 1 year for annual       [place], 2:49 PM 06/14/2020

## 2020-06-14 NOTE — Patient Instructions (Signed)
Health Maintenance, Female Adopting a healthy lifestyle and getting preventive care are important in promoting health and wellness. Ask your health care provider about:  The right schedule for you to have regular tests and exams.  Things you can do on your own to prevent diseases and keep yourself healthy. What should I know about diet, weight, and exercise? Eat a healthy diet   Eat a diet that includes plenty of vegetables, fruits, low-fat dairy products, and lean protein.  Do not eat a lot of foods that are high in solid fats, added sugars, or sodium. Maintain a healthy weight Body mass index (BMI) is used to identify weight problems. It estimates body fat based on height and weight. Your health care provider can help determine your BMI and help you achieve or maintain a healthy weight. Get regular exercise Get regular exercise. This is one of the most important things you can do for your health. Most adults should:  Exercise for at least 150 minutes each week. The exercise should increase your heart rate and make you sweat (moderate-intensity exercise).  Do strengthening exercises at least twice a week. This is in addition to the moderate-intensity exercise.  Spend less time sitting. Even light physical activity can be beneficial. Watch cholesterol and blood lipids Have your blood tested for lipids and cholesterol at 58 years of age, then have this test every 5 years. Have your cholesterol levels checked more often if:  Your lipid or cholesterol levels are high.  You are older than 58 years of age.  You are at high risk for heart disease. What should I know about cancer screening? Depending on your health history and family history, you may need to have cancer screening at various ages. This may include screening for:  Breast cancer.  Cervical cancer.  Colorectal cancer.  Skin cancer.  Lung cancer. What should I know about heart disease, diabetes, and high blood  pressure? Blood pressure and heart disease  High blood pressure causes heart disease and increases the risk of stroke. This is more likely to develop in people who have high blood pressure readings, are of African descent, or are overweight.  Have your blood pressure checked: ? Every 3-5 years if you are 18-39 years of age. ? Every year if you are 40 years old or older. Diabetes Have regular diabetes screenings. This checks your fasting blood sugar level. Have the screening done:  Once every three years after age 40 if you are at a normal weight and have a low risk for diabetes.  More often and at a younger age if you are overweight or have a high risk for diabetes. What should I know about preventing infection? Hepatitis B If you have a higher risk for hepatitis B, you should be screened for this virus. Talk with your health care provider to find out if you are at risk for hepatitis B infection. Hepatitis C Testing is recommended for:  Everyone born from 1945 through 1965.  Anyone with known risk factors for hepatitis C. Sexually transmitted infections (STIs)  Get screened for STIs, including gonorrhea and chlamydia, if: ? You are sexually active and are younger than 58 years of age. ? You are older than 58 years of age and your health care provider tells you that you are at risk for this type of infection. ? Your sexual activity has changed since you were last screened, and you are at increased risk for chlamydia or gonorrhea. Ask your health care provider if   you are at risk.  Ask your health care provider about whether you are at high risk for HIV. Your health care provider may recommend a prescription medicine to help prevent HIV infection. If you choose to take medicine to prevent HIV, you should first get tested for HIV. You should then be tested every 3 months for as long as you are taking the medicine. Pregnancy  If you are about to stop having your period (premenopausal) and  you may become pregnant, seek counseling before you get pregnant.  Take 400 to 800 micrograms (mcg) of folic acid every day if you become pregnant.  Ask for birth control (contraception) if you want to prevent pregnancy. Osteoporosis and menopause Osteoporosis is a disease in which the bones lose minerals and strength with aging. This can result in bone fractures. If you are 65 years old or older, or if you are at risk for osteoporosis and fractures, ask your health care provider if you should:  Be screened for bone loss.  Take a calcium or vitamin D supplement to lower your risk of fractures.  Be given hormone replacement therapy (HRT) to treat symptoms of menopause. Follow these instructions at home: Lifestyle  Do not use any products that contain nicotine or tobacco, such as cigarettes, e-cigarettes, and chewing tobacco. If you need help quitting, ask your health care provider.  Do not use street drugs.  Do not share needles.  Ask your health care provider for help if you need support or information about quitting drugs. Alcohol use  Do not drink alcohol if: ? Your health care provider tells you not to drink. ? You are pregnant, may be pregnant, or are planning to become pregnant.  If you drink alcohol: ? Limit how much you use to 0-1 drink a day. ? Limit intake if you are breastfeeding.  Be aware of how much alcohol is in your drink. In the [place] one drink equals one 12 oz bottle of beer (355 mL), one 5 oz glass of wine (148 mL), or one 1 oz glass of hard liquor (44 mL). General instructions  Schedule regular health, dental, and eye exams.  Stay current with your vaccines.  Tell your health care provider if: ? You often feel depressed. ? You have ever been abused or do not feel safe at home. Summary  Adopting a healthy lifestyle and getting preventive care are important in promoting health and wellness.  Follow your health care provider's instructions about healthy  diet, exercising, and getting tested or screened for diseases.  Follow your health care provider's instructions on monitoring your cholesterol and blood pressure. This information is not intended to replace advice given to you by your health care provider. Make sure you discuss any questions you have with your health care provider. Document Revised: 11/25/2018 Document Reviewed: 11/25/2018 Elsevier Patient Education  2020 [employer]vier [employer].  

## 2020-06-15 LAB — COMPREHENSIVE METABOLIC PANEL
AG Ratio: 1.9 (calc) (ref 1.0–2.5)
ALT: 18 U/L (ref 6–29)
AST: 21 U/L (ref 10–35)
Albumin: 4.7 g/dL (ref 3.6–5.1)
Alkaline phosphatase (APISO): 49 U/L (ref 37–153)
BUN: 17 mg/dL (ref 7–25)
CO2: 31 mmol/L (ref 20–32)
Calcium: 10 mg/dL (ref 8.6–10.4)
Chloride: 103 mmol/L (ref 98–110)
Creat: 0.74 mg/dL (ref 0.50–1.05)
Globulin: 2.5 g/dL (calc) (ref 1.9–3.7)
Glucose, Bld: 100 mg/dL — ABNORMAL HIGH (ref 65–99)
Potassium: 4.1 mmol/L (ref 3.5–5.3)
Sodium: 140 mmol/L (ref 135–146)
Total Bilirubin: 0.5 mg/dL (ref 0.2–1.2)
Total Protein: 7.2 g/dL (ref 6.1–8.1)

## 2020-06-15 LAB — LIPID PANEL
Cholesterol: 215 mg/dL — ABNORMAL HIGH (ref <200)
HDL: 88 mg/dL (ref >=50)
LDL Cholesterol (Calc): 100 mg/dL (calc) — ABNORMAL HIGH
Non-HDL Cholesterol (Calc): 127 mg/dL (calc) (ref <130)
Total CHOL/HDL Ratio: 2.4 (calc) (ref <5.0)
Triglycerides: 172 mg/dL — ABNORMAL HIGH (ref <150)

## 2020-06-15 LAB — CBC WITH DIFFERENTIAL/PLATELET
Absolute Monocytes: 392 cells/uL (ref 200–950)
Basophils Absolute: 32 cells/uL (ref 0–200)
Basophils Relative: 0.7 %
Eosinophils Absolute: 59 cells/uL (ref 15–500)
Eosinophils Relative: 1.3 %
HCT: 41.2 % (ref 35.0–45.0)
Hemoglobin: 13.6 g/dL (ref 11.7–15.5)
Lymphs Abs: 1782 cells/uL (ref 850–3900)
MCH: 31.6 pg (ref 27.0–33.0)
MCHC: 33 g/dL (ref 32.0–36.0)
MCV: 95.6 fL (ref 80.0–100.0)
MPV: 11 fL (ref 7.5–12.5)
Monocytes Relative: 8.7 %
Neutro Abs: 2237 cells/uL (ref 1500–7800)
Neutrophils Relative %: 49.7 %
Platelets: 236 10*3/uL (ref 140–400)
RBC: 4.31 10*6/uL (ref 3.80–5.10)
RDW: 12.3 % (ref 11.0–15.0)
Total Lymphocyte: 39.6 %
WBC: 4.5 10*3/uL (ref 3.8–10.8)

## 2020-07-11 ENCOUNTER — Ambulatory Visit (INDEPENDENT_AMBULATORY_CARE_PROVIDER_SITE_OTHER): Payer: BC Managed Care – PPO

## 2020-07-11 ENCOUNTER — Other Ambulatory Visit: Payer: Self-pay

## 2020-07-11 ENCOUNTER — Other Ambulatory Visit: Payer: Self-pay | Admitting: Nurse Practitioner

## 2020-07-11 DIAGNOSIS — M8589 Other specified disorders of bone density and structure, multiple sites: Secondary | ICD-10-CM

## 2020-07-11 DIAGNOSIS — Z780 Asymptomatic menopausal state: Secondary | ICD-10-CM

## 2020-07-27 ENCOUNTER — Other Ambulatory Visit: Payer: Self-pay

## 2020-07-27 DIAGNOSIS — Z79890 Hormone replacement therapy (postmenopausal): Secondary | ICD-10-CM

## 2020-07-27 MED ORDER — ESTRADIOL 0.5 MG PO TABS: 0.5000 mg | ORAL_TABLET | Freq: Every day | ORAL | 1 refills | Status: DC

## 2020-08-17 ENCOUNTER — Encounter: Payer: Self-pay | Admitting: Nurse Practitioner

## 2021-01-19 ENCOUNTER — Other Ambulatory Visit: Payer: Self-pay | Admitting: Nurse Practitioner

## 2021-01-19 DIAGNOSIS — Z79890 Hormone replacement therapy (postmenopausal): Secondary | ICD-10-CM

## 2021-07-15 ENCOUNTER — Other Ambulatory Visit: Payer: Self-pay | Admitting: Nurse Practitioner

## 2021-07-15 DIAGNOSIS — Z79890 Hormone replacement therapy (postmenopausal): Secondary | ICD-10-CM

## 2021-07-16 ENCOUNTER — Other Ambulatory Visit: Payer: Self-pay

## 2021-07-16 ENCOUNTER — Ambulatory Visit: Payer: BC Managed Care – PPO | Admitting: Nurse Practitioner

## 2021-07-16 ENCOUNTER — Encounter: Payer: Self-pay | Admitting: Obstetrics & Gynecology

## 2021-07-16 ENCOUNTER — Other Ambulatory Visit (HOSPITAL_COMMUNITY)
Admission: RE | Admit: 2021-07-16 | Discharge: 2021-07-16 | Disposition: A | Discharge status: Home / Self Care | Payer: BC Managed Care – PPO | Source: Ambulatory Visit | Attending: Obstetrics & Gynecology | Admitting: Obstetrics & Gynecology

## 2021-07-16 ENCOUNTER — Ambulatory Visit (INDEPENDENT_AMBULATORY_CARE_PROVIDER_SITE_OTHER): Payer: BC Managed Care – PPO | Admitting: Obstetrics & Gynecology

## 2021-07-16 VITALS — BP 120/76 | HR 67 | Resp 16 | Ht 60.25 in | Wt 106.0 lb

## 2021-07-16 DIAGNOSIS — Z79890 Hormone replacement therapy (postmenopausal): Secondary | ICD-10-CM | POA: Diagnosis not present

## 2021-07-16 DIAGNOSIS — Z1272 Encounter for screening for malignant neoplasm of vagina: Secondary | ICD-10-CM | POA: Insufficient documentation

## 2021-07-16 DIAGNOSIS — Z01419 Encounter for gynecological examination (general) (routine) without abnormal findings: Secondary | ICD-10-CM | POA: Insufficient documentation

## 2021-07-16 DIAGNOSIS — M8589 Other specified disorders of bone density and structure, multiple sites: Secondary | ICD-10-CM | POA: Diagnosis not present

## 2021-07-16 MED ORDER — ESTRADIOL 0.5 MG PO TABS: 0.5000 mg | ORAL_TABLET | Freq: Every day | ORAL | 4 refills | Status: DC

## 2021-07-16 NOTE — Progress Notes (Signed)
Bethany Watts 1961-07-18 [id number]   History:    60 y.o. G0   RP:  Established patient presenting for annual gyn exam   HPI: 2012 TVH for fibroids. Estradiol 0.5 mg daily PO with good relief of hot flashes. Normal pap and mammogram history. Breasts normal.  BD 06/2020.  Osteopenia, taking calcium and vitamin D daily. Not sexually active. BMI 20.53.  [place] 2018. [facility] here today.      Past medical history,surgical history, family history and social history were all reviewed and documented in the EPIC chart.  Gynecologic History No LMP recorded. Patient has had a hysterectomy.  Obstetric History OB History  Gravida Para Term Preterm AB Living  0 0 0 0 0 0  SAB IAB Ectopic Multiple Live Births  0 0 0 0 0     ROS: A ROS was performed and pertinent positives and negatives are included in the history.  GENERAL: No fevers or chills. HEENT: No change in vision, no earache, sore throat or sinus congestion. NECK: No pain or stiffness. CARDIOVASCULAR: No chest pain or pressure. No palpitations. PULMONARY: No shortness of breath, cough or wheeze. GASTROINTESTINAL: No abdominal pain, nausea, vomiting or diarrhea, melena or bright red blood per rectum. GENITOURINARY: No urinary frequency, urgency, hesitancy or dysuria. MUSCULOSKELETAL: No joint or muscle pain, no back pain, no recent trauma. DERMATOLOGIC: No rash, no itching, no lesions. ENDOCRINE: No polyuria, polydipsia, no heat or cold intolerance. No recent change in weight. HEMATOLOGICAL: No anemia or easy bruising or bleeding. NEUROLOGIC: No headache, seizures, numbness, tingling or weakness. PSYCHIATRIC: No depression, no loss of interest in normal activity or change in sleep pattern.     Exam:   BP 120/76   Pulse 67   Resp 16   Ht 5' 0.25" (1.53 m)   Wt 106 lb (48.1 kg)   BMI 20.53 kg/m   Body mass index is 20.53 kg/m.  General appearance : Well developed well nourished female. No acute distress HEENT: Eyes: no retinal  hemorrhage or exudates,  Neck supple, trachea midline, no carotid bruits, no thyroidmegaly Lungs: Clear to auscultation, no rhonchi or wheezes, or rib retractions  Heart: Regular rate and rhythm, no murmurs or gallops Breast:Examined in sitting and supine position were symmetrical in appearance, no palpable masses or tenderness,  no skin retraction, no nipple inversion, no nipple discharge, no skin discoloration, no axillary or supraclavicular lymphadenopathy Abdomen: no palpable masses or tenderness, no rebound or guarding Extremities: no edema or skin discoloration or tenderness  Pelvic: Vulva: Normal             Vagina: No gross lesions or discharge.  Pap reflex done.  Cervix: No gross lesions or discharge  Adnexa  Without masses or tenderness  Anus: Normal   Assessment/Plan:  60 y.o. female for annual exam   1. Encounter for Papanicolaou smear of vagina as part of routine gynecological examination Gynecologic exam status post total hysterectomy.  Pap reflex done on the vaginal vault.  Breast exam normal.  Screening mammogram 08/2020 was negative.  Colonoscopy in 2018.  Fasting health labs here today.  Good body mass index at 20.53.  Continue with fitness and healthy nutrition. - CBC - Comp Met (CMET) - Lipid Profile - TSH - Vitamin D 1,25 dihydroxy - Cytology - PAP( [place])  2. Postmenopausal HRT (hormone replacement therapy) Well on estradiol 0.5 mg/tab, 1 tablet per mouth daily.  No contraindication to continue.  Prescription sent to pharmacy. - estradiol (ESTRACE) 0.5  MG tablet; Take 1 tablet (0.5 mg total) by mouth daily.   3. Osteopenia of multiple sites  BD 06/2020 Ostopenia.  Vitamin D supplements, calcium intake of 1.5 g/day total and regular weightbearing physical activities to continue.  We will repeat a bone density in July 2023.  Princess Bruins MD, 11:31 AM 07/16/2021

## 2021-07-17 ENCOUNTER — Encounter: Payer: Self-pay | Admitting: Obstetrics & Gynecology

## 2021-07-18 LAB — CYTOLOGY - PAP: Diagnosis: NEGATIVE

## 2021-07-19 LAB — COMPREHENSIVE METABOLIC PANEL
AG Ratio: 1.8 (calc) (ref 1.0–2.5)
ALT: 13 U/L (ref 6–29)
AST: 18 U/L (ref 10–35)
Albumin: 4.4 g/dL (ref 3.6–5.1)
Alkaline phosphatase (APISO): 43 U/L (ref 37–153)
BUN: 13 mg/dL (ref 7–25)
CO2: 28 mmol/L (ref 20–32)
Calcium: 9.2 mg/dL (ref 8.6–10.4)
Chloride: 104 mmol/L (ref 98–110)
Creat: 0.82 mg/dL (ref 0.50–1.03)
Globulin: 2.4 g/dL (calc) (ref 1.9–3.7)
Glucose, Bld: 87 mg/dL (ref 65–99)
Potassium: 4 mmol/L (ref 3.5–5.3)
Sodium: 140 mmol/L (ref 135–146)
Total Bilirubin: 0.5 mg/dL (ref 0.2–1.2)
Total Protein: 6.8 g/dL (ref 6.1–8.1)

## 2021-07-19 LAB — LIPID PANEL
Cholesterol: 204 mg/dL — ABNORMAL HIGH (ref <200)
HDL: 92 mg/dL (ref >=50)
LDL Cholesterol (Calc): 96 mg/dL (calc)
Non-HDL Cholesterol (Calc): 112 mg/dL (calc) (ref <130)
Total CHOL/HDL Ratio: 2.2 (calc) (ref <5.0)
Triglycerides: 72 mg/dL (ref <150)

## 2021-07-19 LAB — CBC
HCT: 40.4 % (ref 35.0–45.0)
Hemoglobin: 13.3 g/dL (ref 11.7–15.5)
MCH: 31.6 pg (ref 27.0–33.0)
MCHC: 32.9 g/dL (ref 32.0–36.0)
MCV: 96 fL (ref 80.0–100.0)
MPV: 10.8 fL (ref 7.5–12.5)
Platelets: 238 10*3/uL (ref 140–400)
RBC: 4.21 10*6/uL (ref 3.80–5.10)
RDW: 12.5 % (ref 11.0–15.0)
WBC: 4 10*3/uL (ref 3.8–10.8)

## 2021-07-19 LAB — TSH: TSH: 2.85 mIU/L (ref 0.40–4.50)

## 2021-07-19 LAB — VITAMIN D 1,25 DIHYDROXY
Vitamin D 1, 25 (OH)2 Total: 42 pg/mL (ref 18–72)
Vitamin D2 1, 25 (OH)2: <8 pg/mL
Vitamin D3 1, 25 (OH)2: 42 pg/mL

## 2021-08-27 ENCOUNTER — Encounter: Payer: Self-pay | Admitting: Obstetrics & Gynecology

## 2022-07-22 ENCOUNTER — Ambulatory Visit (INDEPENDENT_AMBULATORY_CARE_PROVIDER_SITE_OTHER): Payer: BC Managed Care – PPO | Admitting: Obstetrics & Gynecology

## 2022-07-22 ENCOUNTER — Encounter: Payer: Self-pay | Admitting: Obstetrics & Gynecology

## 2022-07-22 VITALS — BP 100/56 | HR 64 | Resp 12 | Ht 60.25 in | Wt 107.0 lb

## 2022-07-22 DIAGNOSIS — Z01419 Encounter for gynecological examination (general) (routine) without abnormal findings: Secondary | ICD-10-CM

## 2022-07-22 DIAGNOSIS — M8589 Other specified disorders of bone density and structure, multiple sites: Secondary | ICD-10-CM | POA: Diagnosis not present

## 2022-07-22 DIAGNOSIS — Z79890 Hormone replacement therapy (postmenopausal): Secondary | ICD-10-CM

## 2022-07-22 DIAGNOSIS — Z90710 Acquired absence of both cervix and uterus: Secondary | ICD-10-CM | POA: Diagnosis not present

## 2022-07-22 MED ORDER — ESTRADIOL 0.5 MG PO TABS
0.5000 mg | ORAL_TABLET | Freq: Every day | ORAL | 4 refills | Status: DC
Start: 2022-07-22 — End: 2023-08-11

## 2022-07-22 NOTE — Progress Notes (Signed)
Bethany Watts 06/09/1961 [id number]   History:    61 y.o.  G0    RP:  Established patient presenting for annual gyn exam    HPI: 2012 TVH for fibroids. Estradiol 0.5 mg daily PO with good relief of hot flashes. Normal pap h/o.  Pap 07/2021 Neg.  Will repeat Pap at 3 years. Breasts normal. Mammo 08/2021 Neg. BD 06/2020 with Osteopenia Lt Femoral Neck -1.9, taking calcium and vitamin D daily. Will repeat BD here now. Not sexually active. BMI 20.72. 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 [facility] here today.  Will establish with a Fam MD for next year.   Past medical history,surgical history, family history and social history were all reviewed and documented in the EPIC chart.  Gynecologic History No LMP recorded. Patient has had a hysterectomy.  Obstetric History OB History  Gravida Para Term Preterm AB Living  0 0 0 0 0 0  SAB IAB Ectopic Multiple Live Births  0 0 0 0 0     ROS: A ROS was performed and pertinent positives and negatives are included in the history. GENERAL: No fevers or chills. HEENT: No change in vision, no earache, sore throat or sinus congestion. NECK: No pain or stiffness. CARDIOVASCULAR: No chest pain or pressure. No palpitations. PULMONARY: No shortness of breath, cough or wheeze. GASTROINTESTINAL: No abdominal pain, nausea, vomiting or diarrhea, melena or bright red blood per rectum. GENITOURINARY: No urinary frequency, urgency, hesitancy or dysuria. MUSCULOSKELETAL: No joint or muscle pain, no back pain, no recent trauma. DERMATOLOGIC: No rash, no itching, no lesions. ENDOCRINE: No polyuria, polydipsia, no heat or cold intolerance. No recent change in weight. HEMATOLOGICAL: No anemia or easy bruising or bleeding. NEUROLOGIC: No headache, seizures, numbness, tingling or weakness. PSYCHIATRIC: No depression, no loss of interest in normal activity or change in sleep pattern.     Exam:   BP (!) 100/56 (BP Location: Left Arm, Patient Position: Sitting, Cuff Size: Normal)    Pulse 64   Resp 12   Ht 5' 0.25" (1.53 m)   Wt 107 lb (48.5 kg)   BMI 20.72 kg/m   Body mass index is 20.72 kg/m.  General appearance : Well developed well nourished female. No acute distress HEENT: Eyes: no retinal hemorrhage or exudates,  Neck supple, trachea midline, no carotid bruits, no thyroidmegaly Lungs: Clear to auscultation, no rhonchi or wheezes, or rib retractions  Heart: Regular rate and rhythm, no murmurs or gallops Breast:Examined in sitting and supine position were symmetrical in appearance, no palpable masses or tenderness,  no skin retraction, no nipple inversion, no nipple discharge, no skin discoloration, no axillary or supraclavicular lymphadenopathy Abdomen: no palpable masses or tenderness, no rebound or guarding Extremities: no edema or skin discoloration or tenderness  Pelvic: Vulva: Normal             Vagina: No gross lesions or discharge  Cervix: No gross lesions or discharge  Uterus AV, normal size, shape and consistency, non-tender and mobile  Adnexa  Without masses or tenderness  Anus: Normal   Assessment/Plan:  61 y.o. female for annual exam   1. Well female exam with routine gynecological exam 2012 TVH for fibroids. Estradiol 0.5 mg daily PO with good relief of hot flashes. Normal pap h/o.  Pap 07/2021 Neg.  Will repeat Pap at 3 years. Breasts normal. Mammo 08/2021 Neg. BD 06/2020 with Osteopenia Lt Femoral Neck -1.9, taking calcium and vitamin D daily. Will repeat BD here now. Not sexually active. BMI 20.72.  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 [facility] here today.  Will establish with a Fam MD for next year. - CBC - Comp Met (CMET) - Lipid Profile - Vitamin D (25 hydroxy) - TSH  2. S/P vaginal hysterectomy  3. Postmenopausal HRT (hormone replacement therapy) 2012 TVH for fibroids. Estradiol 0.5 mg daily PO with good relief of hot flashes.  No CI to continue.  Prescription sent to pharmacy. - estradiol (ESTRACE) 0.5 MG tablet; Take 1 tablet (0.5 mg  total) by mouth daily.  4. Osteopenia of multiple sites BD 06/2020 with Osteopenia Lt Femoral Neck -1.9, taking calcium and vitamin D daily. Will repeat BD here now.  - DG Bone Density; Future   Princess Bruins MD, 10:43 AM 07/22/2022

## 2022-07-23 LAB — COMPREHENSIVE METABOLIC PANEL
AG Ratio: 2 (calc) (ref 1.0–2.5)
ALT: 15 U/L (ref 6–29)
AST: 19 U/L (ref 10–35)
Albumin: 4.5 g/dL (ref 3.6–5.1)
Alkaline phosphatase (APISO): 45 U/L (ref 37–153)
BUN: 11 mg/dL (ref 7–25)
CO2: 28 mmol/L (ref 20–32)
Calcium: 9.4 mg/dL (ref 8.6–10.4)
Chloride: 104 mmol/L (ref 98–110)
Creat: 0.83 mg/dL (ref 0.50–1.03)
Globulin: 2.3 g/dL (calc) (ref 1.9–3.7)
Glucose, Bld: 90 mg/dL (ref 65–99)
Potassium: 4.2 mmol/L (ref 3.5–5.3)
Sodium: 141 mmol/L (ref 135–146)
Total Bilirubin: 0.5 mg/dL (ref 0.2–1.2)
Total Protein: 6.8 g/dL (ref 6.1–8.1)

## 2022-07-23 LAB — LIPID PANEL
Cholesterol: 216 mg/dL — ABNORMAL HIGH (ref <200)
HDL: 98 mg/dL (ref >=50)
LDL Cholesterol (Calc): 102 mg/dL (calc) — ABNORMAL HIGH
Non-HDL Cholesterol (Calc): 118 mg/dL (calc) (ref <130)
Total CHOL/HDL Ratio: 2.2 (calc) (ref <5.0)
Triglycerides: 70 mg/dL (ref <150)

## 2022-07-23 LAB — CBC
HCT: 39.4 % (ref 35.0–45.0)
Hemoglobin: 13.2 g/dL (ref 11.7–15.5)
MCH: 31.8 pg (ref 27.0–33.0)
MCHC: 33.5 g/dL (ref 32.0–36.0)
MCV: 94.9 fL (ref 80.0–100.0)
MPV: 11 fL (ref 7.5–12.5)
Platelets: 231 10*3/uL (ref 140–400)
RBC: 4.15 10*6/uL (ref 3.80–5.10)
RDW: 12.3 % (ref 11.0–15.0)
WBC: 3.3 10*3/uL — ABNORMAL LOW (ref 3.8–10.8)

## 2022-07-23 LAB — VITAMIN D 25 HYDROXY (VIT D DEFICIENCY, FRACTURES): Vit D, 25-Hydroxy: 39 ng/mL (ref 30–100)

## 2022-07-23 LAB — TSH: TSH: 3.63 mIU/L (ref 0.40–4.50)

## 2022-08-06 ENCOUNTER — Other Ambulatory Visit: Payer: Self-pay | Admitting: Obstetrics & Gynecology

## 2022-08-06 ENCOUNTER — Ambulatory Visit (INDEPENDENT_AMBULATORY_CARE_PROVIDER_SITE_OTHER): Payer: BC Managed Care – PPO

## 2022-08-06 DIAGNOSIS — Z13820 Encounter for screening for osteoporosis: Secondary | ICD-10-CM | POA: Diagnosis not present

## 2022-08-06 DIAGNOSIS — M8589 Other specified disorders of bone density and structure, multiple sites: Secondary | ICD-10-CM

## 2022-08-06 DIAGNOSIS — Z780 Asymptomatic menopausal state: Secondary | ICD-10-CM

## 2022-08-06 DIAGNOSIS — M85851 Other specified disorders of bone density and structure, right thigh: Secondary | ICD-10-CM

## 2022-08-08 ENCOUNTER — Other Ambulatory Visit: Payer: Self-pay | Admitting: *Deleted

## 2022-08-08 DIAGNOSIS — D72819 Decreased white blood cell count, unspecified: Secondary | ICD-10-CM

## 2022-08-22 ENCOUNTER — Encounter: Payer: Self-pay | Admitting: Obstetrics & Gynecology

## 2022-08-26 NOTE — Telephone Encounter (Signed)
Bethany Bruins, MD  You 3 days ago   Suggest Terri Bauert with [facility] psychology (please verify that she is still there    Message left with that office to confirm she is still practicing there.

## 2022-08-26 NOTE — Telephone Encounter (Signed)
Dr. Dellis Filbert, Leanne Lovely is still with [facility] but any new patients have to go on wait list as she is not taking new patients curently.  Another recommendation?

## 2022-09-02 ENCOUNTER — Encounter: Payer: Self-pay | Admitting: Obstetrics & Gynecology

## 2022-12-26 DIAGNOSIS — G44319 Acute post-traumatic headache, not intractable: Secondary | ICD-10-CM | POA: Insufficient documentation

## 2022-12-26 DIAGNOSIS — M7711 Lateral epicondylitis, right elbow: Secondary | ICD-10-CM | POA: Insufficient documentation

## 2022-12-26 HISTORY — DX: Acute post-traumatic headache, not intractable: G44.319

## 2023-03-06 ENCOUNTER — Encounter: Payer: Self-pay | Admitting: Obstetrics and Gynecology

## 2023-03-06 ENCOUNTER — Ambulatory Visit: Payer: BC Managed Care – PPO | Admitting: Obstetrics and Gynecology

## 2023-03-06 VITALS — BP 138/80 | HR 111 | Resp 18

## 2023-03-06 DIAGNOSIS — B3731 Acute candidiasis of vulva and vagina: Secondary | ICD-10-CM

## 2023-03-06 DIAGNOSIS — Z1211 Encounter for screening for malignant neoplasm of colon: Secondary | ICD-10-CM

## 2023-03-06 DIAGNOSIS — N762 Acute vulvitis: Secondary | ICD-10-CM

## 2023-03-06 DIAGNOSIS — R232 Flushing: Secondary | ICD-10-CM | POA: Diagnosis not present

## 2023-03-06 HISTORY — DX: Encounter for screening for malignant neoplasm of colon: Z12.11

## 2023-03-06 LAB — WET PREP FOR TRICH, YEAST, CLUE

## 2023-03-06 MED ORDER — CLOBETASOL PROPIONATE 0.05 % EX OINT: TOPICAL_OINTMENT | CUTANEOUS | 0 refills | Status: DC

## 2023-03-06 MED ORDER — FLUCONAZOLE 150 MG PO TABS: 150.0000 mg | ORAL_TABLET | Freq: Once | ORAL | 0 refills | Status: AC

## 2023-03-06 NOTE — Progress Notes (Signed)
GYNECOLOGY  VISIT   HPI: 62 y.o.   Divorced Asian Not Hispanic or Latino  female   [id number] with No LMP recorded. Patient has had a hysterectomy.   here for intermittent vaginal itching, started 01/2023. No increase discharge, no odor.   Increase in hot flashes, she is on oral ERT (0.5 mg daily). She is having 1-2 tolerable hot flashes during the day. Night sweats a couple of times a week.  GYNECOLOGIC HISTORY: No LMP recorded. Patient has had a hysterectomy. Contraception: Hysterectomy Menopausal hormone therapy: Yes, estradiol        OB History     Gravida  0   Para  0   Term  0   Preterm  0   AB  0   Living  0      SAB  0   IAB  0   Ectopic  0   Multiple  0   Live Births  0              Patient Active Problem List   Diagnosis Date Noted   History of total vaginal hysterectomy (TVH) 06/14/2020   Osteopenia of multiple sites 06/14/2020   Back injury    Hiatal hernia    Ankle fracture    Anemia     Past Medical History:  Diagnosis Date   Ankle fracture 11-12   RIGHT-DUE TO A FALL   Fibroid    Hiatal hernia     Past Surgical History:  Procedure Laterality Date   VAGINAL HYSTERECTOMY  [id number]   TOTAL LAPAROSCOPIC HYSTERECTOMY    Current Outpatient Medications  Medication Sig Dispense Refill   calcium-vitamin D (OSCAL WITH D) 500-200 MG-UNIT per tablet Take 1 tablet by mouth daily.       estradiol (ESTRACE) 0.5 MG tablet Take 1 tablet (0.5 mg total) by mouth daily. 90 tablet 4   Multiple Vitamin (MULTIVITAMIN) tablet Take 1 tablet by mouth daily.       No current facility-administered medications for this visit.     ALLERGIES: Patient has no known allergies.  Family History  Problem Relation Age of Onset   Diabetes Paternal Grandmother     Social History   Socioeconomic History   Marital status: Divorced    Spouse name: Not on file   Number of children: Not on file   Years of education: Not on file   Highest education level: Not on  file  Occupational History   Not on file  Tobacco Use   Smoking status: Former   Smokeless tobacco: Never  Vaping Use   Vaping Use: Never used  Substance and Sexual Activity   Alcohol use: Yes    Alcohol/week: 4.0 standard drinks of alcohol    Types: 4 Standard drinks or equivalent per week   Drug use: No   Sexual activity: Not Currently    Birth control/protection: Surgical    Comment: intercourse age unknown, 5 sexual partners, hysterectomy  Other Topics Concern   Not on file  Social History Narrative   Not on file   Social Determinants of Health   Financial Resource Strain: Not on file  Food Insecurity: Not on file  Transportation Needs: Not on file  Physical Activity: Not on file  Stress: Not on file  Social Connections: Not on file  Intimate Partner Violence: Not on file    Review of Systems  Genitourinary:        Vaginal itching    PHYSICAL EXAMINATION:  BP 138/80 (BP Location: Right Arm, Patient Position: Sitting)   Pulse (!) 111   Resp 18     General appearance: alert, cooperative and appears stated age  Pelvic: External genitalia:  no lesions              Urethra:  normal appearing urethra with no masses, tenderness or lesions              Bartholins and Skenes: normal                 Vagina: normal appearing vagina with normal color and discharge, no lesions              Cervix: absent               Chaperone was present for exam.  1. Acute vulvitis - WET PREP FOR TRICH, YEAST, CLUE - clobetasol ointment (TEMOVATE) 0.05 %; Apply a pea sized amount twice daily for up to 2 weeks as needed.  Dispense: 60 g; Refill: 0  2. Yeast vaginitis - fluconazole (DIFLUCAN) 150 MG tablet; Take 1 tablet (150 mg total) by mouth once for 1 dose. Take one tablet.  Repeat in 72 hours if symptoms are not completely resolved.  Dispense: 2 tablet; Refill: 0  3. Hot flashes She is on ERT, overall symptoms are tolerable. Discussed behavior changes to help.

## 2023-03-06 NOTE — Patient Instructions (Signed)

## 2023-06-05 ENCOUNTER — Ambulatory Visit: Payer: BC Managed Care – PPO | Admitting: Obstetrics and Gynecology

## 2023-06-05 ENCOUNTER — Encounter: Payer: Self-pay | Admitting: Obstetrics and Gynecology

## 2023-06-05 VITALS — BP 110/74 | HR 62 | Wt 110.0 lb

## 2023-06-05 DIAGNOSIS — N760 Acute vaginitis: Secondary | ICD-10-CM

## 2023-06-05 DIAGNOSIS — B3731 Acute candidiasis of vulva and vagina: Secondary | ICD-10-CM | POA: Diagnosis not present

## 2023-06-05 LAB — WET PREP FOR TRICH, YEAST, CLUE

## 2023-06-05 MED ORDER — FLUCONAZOLE 150 MG PO TABS
150.0000 mg | ORAL_TABLET | Freq: Once | ORAL | 0 refills | Status: AC
Start: 2023-06-05 — End: 2023-06-05

## 2023-06-05 NOTE — Progress Notes (Signed)
GYNECOLOGY  VISIT   HPI: 62 y.o.   Divorced Asian Not Hispanic or Latino  female   [id number] with No LMP recorded. Patient has had a hysterectomy.   here for vaginal itching.  Symptoms started a few days ago. No d/c. She has been using the steroid ointment she had from 3/24 and is feeling better currently.   She had yeast in 3/24.   GYNECOLOGIC HISTORY: No LMP recorded. Patient has had a hysterectomy. Contraception:hysterectomy  Menopausal hormone therapy: estradiol.         OB History     Gravida  0   Para  0   Term  0   Preterm  0   AB  0   Living  0      SAB  0   IAB  0   Ectopic  0   Multiple  0   Live Births  0              Patient Active Problem List   Diagnosis Date Noted   Colon cancer screening 03/06/2023   Lateral epicondylitis of right elbow 12/26/2022   Acute posttraumatic headache 12/26/2022   History of total vaginal hysterectomy (TVH) 06/14/2020   Osteopenia of multiple sites 06/14/2020   Right elbow pain 01/03/2018   Back injury    Hiatal hernia    Ankle fracture    Anemia     Past Medical History:  Diagnosis Date   Ankle fracture 11-12   RIGHT-DUE TO A FALL   Fibroid    Hiatal hernia     Past Surgical History:  Procedure Laterality Date   VAGINAL HYSTERECTOMY  295621   TOTAL LAPAROSCOPIC HYSTERECTOMY    Current Outpatient Medications  Medication Sig Dispense Refill   calcium-vitamin D (OSCAL WITH D) 500-200 MG-UNIT per tablet Take 1 tablet by mouth daily.       clobetasol ointment (TEMOVATE) 0.05 % Apply a pea sized amount twice daily for up to 2 weeks as needed. 60 g 0   estradiol (ESTRACE) 0.5 MG tablet Take 1 tablet (0.5 mg total) by mouth daily. 90 tablet 4   Multiple Vitamin (MULTIVITAMIN) tablet Take 1 tablet by mouth daily.       No current facility-administered medications for this visit.     ALLERGIES: Patient has no known allergies.  Family History  Problem Relation Age of Onset   Diabetes Paternal  Grandmother     Social History   Socioeconomic History   Marital status: Divorced    Spouse name: Not on file   Number of children: Not on file   Years of education: Not on file   Highest education level: Not on file  Occupational History   Not on file  Tobacco Use   Smoking status: Former   Smokeless tobacco: Never  Vaping Use   Vaping Use: Never used  Substance and Sexual Activity   Alcohol use: Yes    Alcohol/week: 4.0 standard drinks of alcohol    Types: 4 Standard drinks or equivalent per week   Drug use: No   Sexual activity: Not Currently    Birth control/protection: Surgical    Comment: intercourse age unknown, 5 sexual partners, hysterectomy  Other Topics Concern   Not on file  Social History Narrative   Not on file   Social Determinants of Health   Financial Resource Strain: Not on file  Food Insecurity: Not on file  Transportation Needs: Not on file  Physical Activity: Not on file  Stress: Not on file  Social Connections: Not on file  Intimate Partner Violence: Not on file    Review of Systems  All other systems reviewed and are negative.   PHYSICAL EXAMINATION:    BP 110/74   Pulse 62   Wt 110 lb (49.9 kg)   SpO2 100%   BMI 21.30 kg/m     General appearance: alert, cooperative and appears stated age   Pelvic: External genitalia:  no lesions, mild atrophy, mild agglutination, no whitening, no fissures.               Urethra:  normal appearing urethra with no masses, tenderness or lesions              Bartholins and Skenes: normal                 Vagina: normal appearing vagina with normal color and discharge, no lesions              Cervix: absent               Chaperone was present for exam.  1. Vulvovaginitis - WET PREP FOR TRICH, YEAST, CLUE -she has left over steroid ointment which she is using and is helping  2. Yeast vaginitis - fluconazole (DIFLUCAN) 150 MG tablet; Take 1 tablet (150 mg total) by mouth once for 1 dose. Take one  tablet.  Repeat in 72 hours x 1  Dispense: 2 tablet; Refill: 0

## 2023-06-05 NOTE — Patient Instructions (Signed)

## 2023-08-11 ENCOUNTER — Other Ambulatory Visit: Payer: Self-pay | Admitting: Obstetrics & Gynecology

## 2023-08-11 DIAGNOSIS — Z79890 Hormone replacement therapy (postmenopausal): Secondary | ICD-10-CM

## 2023-08-11 NOTE — Telephone Encounter (Signed)
Med refill request: estradiol 0.5 mg tab PO daily Last AEX: 07/22/22 -ML Next AEX: 10/09/23 -EB Last MMG (if hormonal med) 08/29/22 BiRads 1neg at [facility]  Rx pended #90/0RF   Refill authorized: Please Advise?

## 2023-09-08 ENCOUNTER — Encounter: Payer: Self-pay | Admitting: Radiology

## 2023-09-25 ENCOUNTER — Ambulatory Visit: Payer: BC Managed Care – PPO | Admitting: Obstetrics and Gynecology

## 2023-09-25 ENCOUNTER — Encounter: Payer: Self-pay | Admitting: Obstetrics and Gynecology

## 2023-09-25 VITALS — BP 120/84 | HR 66

## 2023-09-25 DIAGNOSIS — B3731 Acute candidiasis of vulva and vagina: Secondary | ICD-10-CM | POA: Diagnosis not present

## 2023-09-25 DIAGNOSIS — M8580 Other specified disorders of bone density and structure, unspecified site: Secondary | ICD-10-CM | POA: Diagnosis not present

## 2023-09-25 DIAGNOSIS — Z01419 Encounter for gynecological examination (general) (routine) without abnormal findings: Secondary | ICD-10-CM

## 2023-09-25 DIAGNOSIS — N898 Other specified noninflammatory disorders of vagina: Secondary | ICD-10-CM

## 2023-09-25 LAB — WET PREP FOR TRICH, YEAST, CLUE

## 2023-09-25 MED ORDER — FLUCONAZOLE 150 MG PO TABS: 150.0000 mg | ORAL_TABLET | Freq: Once | ORAL | 1 refills | Status: AC

## 2023-09-25 NOTE — Progress Notes (Signed)
   Acute Office Visit  Subjective:    Patient ID: Bethany Watts, female    DOB: [birth year], 62 y.o.   MRN: [id number]   HPI 62 y.o. presents today for Vaginitis (Pt c/o external vaginal itching, some discharge with no odor//jj) She reports she has been having more yeast infections recently  No LMP recorded. Patient has had a hysterectomy.    Review of Systems     Objective:    Physical Exam Genitourinary:     Vaginal discharge present.     Cervix is absent.     There were no vitals taken for this visit. Wt Readings from Last 3 Encounters:  06/05/23 110 lb (49.9 kg)  07/22/22 107 lb (48.5 kg)  07/16/21 106 lb (48.1 kg)        Patient informed chaperone available to be present for breast and/or pelvic exam. Patient has requested no chaperone to be present. Patient has been advised what will be completed during breast and pelvic exam.   Assessment & Plan:  There are no diagnoses linked to this encounter.   Yeast infection Nuswab sent.  To notify patient of results and treatment.  RTC for labs and annual exam Earley Favor

## 2023-10-09 ENCOUNTER — Encounter: Payer: Self-pay | Admitting: Obstetrics and Gynecology

## 2023-10-09 ENCOUNTER — Ambulatory Visit (INDEPENDENT_AMBULATORY_CARE_PROVIDER_SITE_OTHER): Payer: BC Managed Care – PPO | Admitting: Obstetrics and Gynecology

## 2023-10-09 VITALS — BP 104/68 | HR 64 | Ht 60.25 in | Wt 104.0 lb

## 2023-10-09 DIAGNOSIS — Z01419 Encounter for gynecological examination (general) (routine) without abnormal findings: Secondary | ICD-10-CM | POA: Diagnosis not present

## 2023-10-09 DIAGNOSIS — Z202 Contact with and (suspected) exposure to infections with a predominantly sexual mode of transmission: Secondary | ICD-10-CM

## 2023-10-09 DIAGNOSIS — Z90710 Acquired absence of both cervix and uterus: Secondary | ICD-10-CM

## 2023-10-09 DIAGNOSIS — N939 Abnormal uterine and vaginal bleeding, unspecified: Secondary | ICD-10-CM

## 2023-10-09 DIAGNOSIS — Z23 Encounter for immunization: Secondary | ICD-10-CM

## 2023-10-09 LAB — WET PREP FOR TRICH, YEAST, CLUE

## 2023-10-09 MED ORDER — FLUCONAZOLE 150 MG PO TABS: 150.0000 mg | ORAL_TABLET | Freq: Once | ORAL | 0 refills | Status: AC

## 2023-10-09 MED ORDER — METRONIDAZOLE 500 MG PO TABS: 500.0000 mg | ORAL_TABLET | Freq: Two times a day (BID) | ORAL | 0 refills | Status: DC

## 2023-10-09 MED ORDER — ESTRADIOL 0.1 MG/GM VA CREA: 1.0000 | TOPICAL_CREAM | Freq: Every day | VAGINAL | 12 refills | Status: AC

## 2023-10-09 NOTE — Progress Notes (Addendum)
62 y.o. y.o. female here for annual exam  No LMP recorded. Patient has had a hysterectomy.    RP:  Established patient presenting for annual gyn exam    HPI: 2012 TVH for fibroids. Estradiol 0.5 mg daily PO with moderate  relief of hot flashes. May start weaning next year since 92yr use. Normal pap h/o.  Pap 07/2021 Neg.   Breasts normal. Mammo 08/2023 Neg. BD 06/2020 with Osteopenia Lt Femoral Neck -1.9, taking calcium and vitamin D daily. Will repeat BD this year. Not sexually active. Body mass index is 20.14 kg/m. 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 Health Labs here today.  Will establish with a Fam MD for next year.   Out of relationship now and would like STI testing.  She does reports discharge.  She is feeling better after taking a second diflucan. 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   Past medical history,surgical history, family history and social history were Blood pressure 104/68, pulse 64, height 5' 0.25" (1.53 m), weight 104 lb (47.2 kg), SpO2 98%.     Component Value Date/Time   DIAGPAP  07/16/2021 1155    - Negative for intraepithelial lesion or malignancy (NILM)   ADEQPAP Satisfactory for evaluation. 07/16/2021 1155    GYN HISTORY:    Component Value Date/Time   DIAGPAP  07/16/2021 1155    - Negative for intraepithelial lesion or malignancy (NILM)   ADEQPAP Satisfactory for evaluation. 07/16/2021 1155    OB History  Gravida Para Term Preterm AB Living  0 0 0 0 0 0  SAB IAB Ectopic Multiple Live Births  0 0 0 0 0    Past Medical History:  Diagnosis Date   Ankle fracture 10/17/2011   RIGHT-DUE TO A FALL   COVID    8/24   Fibroid    Hiatal hernia     Past Surgical History:  Procedure Laterality Date   VAGINAL HYSTERECTOMY  [id number]   TOTAL LAPAROSCOPIC HYSTERECTOMY    Current Outpatient Medications on File Prior to Visit  Medication Sig Dispense Refill   calcium-vitamin D (OSCAL WITH D) 500-200 MG-UNIT per tablet Take 1 tablet by mouth  daily.       estradiol (ESTRACE) 0.5 MG tablet TAKE 1 TABLET BY MOUTH EVERY DAY 90 tablet 0   Multiple Vitamin (MULTIVITAMIN) tablet Take 1 tablet by mouth daily.       No current facility-administered medications on file prior to visit.    Social History   Socioeconomic History   Marital status: Divorced    Spouse name: Not on file   Number of children: Not on file   Years of education: Not on file   Highest education level: Not on file  Occupational History   Not on file  Tobacco Use   Smoking status: Former   Smokeless tobacco: Never  Vaping Use   Vaping status: Never Used  Substance and Sexual Activity   Alcohol use: Not Currently   Drug use: No   Sexual activity: Not Currently    Partners: Male    Birth control/protection: Surgical, Abstinence    Comment: intercourse age unknown, 5 sexual partners, hysterectomy  Other Topics Concern   Not on file  Social History Narrative   Not on file   Social Determinants of Health   Financial Resource Strain: Not on file  Food Insecurity: Not on file  Transportation Needs: Not on file  Physical Activity: Not on file  Stress: Not on file  Social Connections: Not on file  Intimate Partner Violence: Not on file    Family History  Problem Relation Age of Onset   Diabetes Paternal Grandmother      No Known Allergies    Patient's last menstrual period was    Review of Systems Alls systems reviewed and are negative.     Physical Exam Constitutional:      Appearance: Normal appearance.  Genitourinary:     Vulva normal.     No lesions in the vagina.     Right Labia: No rash, lesions or skin changes.    Left Labia: No lesions, skin changes or rash.    Vaginal cuff intact.    Vaginal discharge present.     No vaginal tenderness.     No vaginal prolapse present.    Moderate vaginal atrophy present.     Right Adnexa: not tender and no mass present.    Left Adnexa: not tender and no mass present.    Cervix is  absent.     Uterus is absent.  Breasts:    Right: Normal.     Left: Normal.  HENT:     Head: Normocephalic.  Neck:     Thyroid: No thyroid mass, thyromegaly or thyroid tenderness.  Cardiovascular:     Rate and Rhythm: Normal rate and regular rhythm.     Heart sounds: Normal heart sounds, S1 normal and S2 normal.  Pulmonary:     Effort: Pulmonary effort is normal.     Breath sounds: Normal breath sounds and air entry.  Abdominal:     General: There is no distension.     Palpations: Abdomen is soft. There is no mass.     Tenderness: There is no abdominal tenderness. There is no guarding or rebound.  Musculoskeletal:        General: Normal range of motion.     Cervical back: Full passive range of motion without pain, normal range of motion and neck supple. No tenderness.     Right lower leg: No edema.     Left lower leg: No edema.  Neurological:     Mental Status: She is alert.  Skin:    General: Skin is warm.  Psychiatric:        Mood and Affect: Mood normal.        Behavior: Behavior normal.        Thought Content: Thought content normal.  Vitals and nursing note reviewed. Exam conducted with a chaperone present.       A:         Well Woman GYN exam                             P:        Pap smear not indicated Encouraged annual mammogram screening Colon cancer screening up-to-date DXA ordered today Labs and immunizations ordered today Discussed breast self exams Encouraged healthy lifestyle practices Encouraged Vit D and Calcium  STI panel ordered. Discharge: BV noted. Flagyl sent.  Counseled on BV Recurrent yeast infections: feels better after the second dose of diflucan.  Refill sent.  To begin estrace cream to help balance the PH to see if this helps reduce the amount of infections  No follow-ups on file.  Earley Favor

## 2023-10-09 NOTE — Addendum Note (Signed)
Addended by: Earley Favor on: 10/09/2023 10:39 AM   Modules accepted: Orders

## 2023-10-09 NOTE — Addendum Note (Signed)
Addended by: Earley Favor on: 10/09/2023 10:33 AM   Modules accepted: Orders

## 2023-10-10 ENCOUNTER — Other Ambulatory Visit: Payer: Self-pay

## 2023-10-10 LAB — HIV ANTIBODY (ROUTINE TESTING W REFLEX): HIV 1&2 Ab, 4th Generation: NONREACTIVE

## 2023-10-10 LAB — RPR: RPR Ser Ql: NONREACTIVE

## 2023-10-10 LAB — HEPATITIS C ANTIBODY: Hepatitis C Ab: NONREACTIVE

## 2023-10-10 MED ORDER — METRONIDAZOLE 0.75 % VA GEL
1.0000 | Freq: Two times a day (BID) | VAGINAL | 0 refills | Status: AC
Start: 2023-10-10 — End: 2023-10-15

## 2023-10-11 LAB — URINALYSIS, COMPLETE W/RFL CULTURE
Bilirubin Urine: NEGATIVE
Glucose, UA: NEGATIVE
Hgb urine dipstick: NEGATIVE
Hyaline Cast: NONE SEEN /[LPF]
Ketones, ur: NEGATIVE
Nitrites, Initial: NEGATIVE
Protein, ur: NEGATIVE
RBC / HPF: NONE SEEN /[HPF] (ref 0–2)
Specific Gravity, Urine: 1.01 (ref 1.001–1.035)
pH: 7.5 (ref 5.0–8.0)

## 2023-10-11 LAB — URINE CULTURE
MICRO NUMBER:: 15638383
SPECIMEN QUALITY:: ADEQUATE

## 2023-10-11 LAB — SURESWAB CT/NG/T. VAGINALIS
C. trachomatis RNA, TMA: NOT DETECTED
N. gonorrhoeae RNA, TMA: NOT DETECTED
Trichomonas vaginalis RNA: NOT DETECTED

## 2023-10-11 LAB — CULTURE INDICATED

## 2023-10-16 NOTE — Addendum Note (Signed)
Addended by: Jodelle Red D on: 10/16/2023 03:51 PM   Modules accepted: Orders

## 2023-10-16 NOTE — Telephone Encounter (Signed)
Per EB:  "Not sure what the tissue is. We can do a PUS just to verify the endometrial lining is normal and the uterus and ovaries are as well   Let's have her come back for annual labs. I would finish the bv gel and then start back on estrogen cream after that Dr. Karma Greaser"

## 2023-10-16 NOTE — Telephone Encounter (Signed)
Msg sent to appt desk.  

## 2023-10-20 NOTE — Telephone Encounter (Signed)
Melton Alar, CMA Left message for patient to call and schedule appointment.

## 2023-10-21 ENCOUNTER — Other Ambulatory Visit: Payer: BC Managed Care – PPO

## 2023-10-30 ENCOUNTER — Encounter: Payer: Self-pay | Admitting: Obstetrics and Gynecology

## 2023-10-30 ENCOUNTER — Ambulatory Visit: Payer: BC Managed Care – PPO | Admitting: Obstetrics and Gynecology

## 2023-10-30 VITALS — BP 110/70 | HR 56 | Temp 97.8°F

## 2023-10-30 DIAGNOSIS — Z01419 Encounter for gynecological examination (general) (routine) without abnormal findings: Secondary | ICD-10-CM

## 2023-10-30 DIAGNOSIS — R350 Frequency of micturition: Secondary | ICD-10-CM | POA: Diagnosis not present

## 2023-10-30 DIAGNOSIS — N952 Postmenopausal atrophic vaginitis: Secondary | ICD-10-CM

## 2023-10-30 LAB — URINALYSIS, COMPLETE W/RFL CULTURE
Bacteria, UA: NONE SEEN /[HPF]
Bilirubin Urine: NEGATIVE
Casts: NONE SEEN /[LPF]
Crystals: NONE SEEN /[HPF]
Glucose, UA: NEGATIVE
Hgb urine dipstick: NEGATIVE
Ketones, ur: NEGATIVE
Leukocyte Esterase: NEGATIVE
Nitrites, Initial: NEGATIVE
Protein, ur: NEGATIVE
RBC / HPF: NONE SEEN /[HPF] (ref 0–2)
Specific Gravity, Urine: 1.015 (ref 1.001–1.035)
Yeast: NONE SEEN /[HPF]
pH: 7.5 (ref 5.0–8.0)

## 2023-10-30 NOTE — Patient Instructions (Signed)
UROGYN REFERRAL: Dr. Jay Schlichter and Dr. Florian Buff at [facility] with [facility] office: [address], [address] [phone]  The referral was placed but it may take a couple of weeks for the visit to be coordinated.

## 2023-10-30 NOTE — Progress Notes (Addendum)
   Acute Office Visit  Subjective:    Patient ID: Bethany Watts, female    DOB: 08-20-1961, 62 y.o.   MRN: [id number]   HPI 62 y.o. presents today for Urinary Tract Infection (Uti//jj/Pt c/o urination frequently, pelvic discomfort, pressure, vaginal itching) . Patient presents with urinary frequency and suprapubic heaviness that was worse yesterday. Had some dysuria yesterday as well.  UA today negative no yeast or clue cells seen She is wondering why her pelvis and suprapubic region feels so heavy as well  No LMP recorded. Patient has had a hysterectomy.    Review of Systems     Objective:    OBGyn Exam  BP 110/70   Pulse (!) 56   Temp 97.8 F (36.6 C) (Oral)   SpO2 99%  Wt Readings from Last 3 Encounters:  10/09/23 104 lb (47.2 kg)  06/05/23 110 lb (49.9 kg)  07/22/22 107 lb (48.5 kg)      SVE: possible early cystocele no rectocele Atrophic vaginitis Normal discharge No leisons  Patient informed chaperone available to be present for breast and/or pelvic exam. Patient has requested no chaperone to be present. Patient has been advised what will be completed during breast and pelvic exam.   Assessment & Plan:    Continue to use PV estrace cream RTC next week for TV US   Earley Favor

## 2023-10-31 LAB — COMPREHENSIVE METABOLIC PANEL
AG Ratio: 1.8 (calc) (ref 1.0–2.5)
ALT: 19 U/L (ref 6–29)
AST: 20 U/L (ref 10–35)
Albumin: 4.6 g/dL (ref 3.6–5.1)
Alkaline phosphatase (APISO): 62 U/L (ref 37–153)
BUN: 13 mg/dL (ref 7–25)
CO2: 29 mmol/L (ref 20–32)
Calcium: 9.7 mg/dL (ref 8.6–10.4)
Chloride: 102 mmol/L (ref 98–110)
Creat: 0.7 mg/dL (ref 0.50–1.05)
Globulin: 2.5 g/dL (ref 1.9–3.7)
Glucose, Bld: 91 mg/dL (ref 65–99)
Potassium: 4 mmol/L (ref 3.5–5.3)
Sodium: 139 mmol/L (ref 135–146)
Total Bilirubin: 0.5 mg/dL (ref 0.2–1.2)
Total Protein: 7.1 g/dL (ref 6.1–8.1)

## 2023-10-31 LAB — CBC
HCT: 40.3 % (ref 35.0–45.0)
Hemoglobin: 13.4 g/dL (ref 11.7–15.5)
MCH: 31.9 pg (ref 27.0–33.0)
MCHC: 33.3 g/dL (ref 32.0–36.0)
MCV: 96 fL (ref 80.0–100.0)
MPV: 11.3 fL (ref 7.5–12.5)
Platelets: 233 10*3/uL (ref 140–400)
RBC: 4.2 10*6/uL (ref 3.80–5.10)
RDW: 12.5 % (ref 11.0–15.0)
WBC: 3.9 10*3/uL (ref 3.8–10.8)

## 2023-10-31 LAB — HEMOGLOBIN A1C
Hgb A1c MFr Bld: 5.6 %{Hb} (ref <5.7)
Mean Plasma Glucose: 114 mg/dL
eAG (mmol/L): 6.3 mmol/L

## 2023-10-31 LAB — LIPID PANEL
Cholesterol: 208 mg/dL — ABNORMAL HIGH (ref <200)
HDL: 90 mg/dL (ref >=50)
LDL Cholesterol (Calc): 101 mg/dL — ABNORMAL HIGH
Non-HDL Cholesterol (Calc): 118 mg/dL (ref <130)
Total CHOL/HDL Ratio: 2.3 (calc) (ref <5.0)
Triglycerides: 81 mg/dL (ref <150)

## 2023-10-31 LAB — TSH: TSH: 2.82 m[IU]/L (ref 0.40–4.50)

## 2023-11-04 LAB — VITAMIN D 1,25 DIHYDROXY
Vitamin D 1, 25 (OH)2 Total: 66 pg/mL (ref 18–72)
Vitamin D2 1, 25 (OH)2: <8 pg/mL
Vitamin D3 1, 25 (OH)2: 66 pg/mL

## 2023-11-06 ENCOUNTER — Ambulatory Visit: Payer: BC Managed Care – PPO

## 2023-11-06 ENCOUNTER — Encounter: Payer: Self-pay | Admitting: Obstetrics and Gynecology

## 2023-11-06 ENCOUNTER — Ambulatory Visit (INDEPENDENT_AMBULATORY_CARE_PROVIDER_SITE_OTHER): Payer: BC Managed Care – PPO | Admitting: Obstetrics and Gynecology

## 2023-11-06 VITALS — BP 110/72 | HR 63

## 2023-11-06 DIAGNOSIS — N939 Abnormal uterine and vaginal bleeding, unspecified: Secondary | ICD-10-CM | POA: Diagnosis not present

## 2023-11-06 DIAGNOSIS — Z90710 Acquired absence of both cervix and uterus: Secondary | ICD-10-CM

## 2023-11-06 DIAGNOSIS — Z712 Person consulting for explanation of examination or test findings: Secondary | ICD-10-CM

## 2023-11-06 NOTE — Patient Instructions (Signed)
UROGYN REFERRAL: Dr. Jay Schlichter and Dr. Florian Buff at [facility] with [facility] office: [address], [address] [phone]  The referral was placed but it may take a couple of weeks for the visit to be coordinated.

## 2023-11-06 NOTE — Progress Notes (Signed)
Patient presents for [place] follow-up. She states she is doing better today. She noted particles after using the metrogel but not estrogen cream She states it was brown Reports the vulvar pruritus is better and is not present today. She reports she is using the estrogen cream PV.  Blood pressure 110/72, pulse 63, SpO2 99%.   Ultrasound report today with left ovary 1.8 cm x 1.36 cm right ovary with 1.61 x 1.37 cm.  Right ovary with 8.1 mm follicle Uterus surgically removed Vaginal cuff within normal limits Ovaries atrophic in size simple residual follicle seen on right ovary no adnexal masses no free fluid  A/p [place] results  [place] results reviewed with patient and are normal today RTC with any concerns  Dr. Karma Greaser 20 minutes spent on reviewing records, imaging,  and one on one patient time and counseling patient and documentation Dr. Karma Greaser

## 2023-11-07 NOTE — Addendum Note (Signed)
Addended by: Earley Favor on: 11/07/2023 10:58 AM   Modules accepted: Level of Service

## 2023-11-13 ENCOUNTER — Other Ambulatory Visit: Payer: Self-pay | Admitting: Obstetrics and Gynecology

## 2023-11-13 DIAGNOSIS — Z79890 Hormone replacement therapy (postmenopausal): Secondary | ICD-10-CM

## 2023-11-17 NOTE — Telephone Encounter (Signed)
Med refill request: Oral Estrace Last AEX: 10/09/2023-EB Next AEX:  Last MMG (if hormonal med): 09/04/2023-neg birads 1 Refill authorized:

## 2024-06-14 DIAGNOSIS — M25511 Pain in right shoulder: Secondary | ICD-10-CM | POA: Insufficient documentation

## 2024-06-15 DIAGNOSIS — M19011 Primary osteoarthritis, right shoulder: Secondary | ICD-10-CM | POA: Insufficient documentation

## 2024-07-16 DIAGNOSIS — M7662 Achilles tendinitis, left leg: Secondary | ICD-10-CM | POA: Insufficient documentation

## 2024-08-12 DIAGNOSIS — M25579 Pain in unspecified ankle and joints of unspecified foot: Secondary | ICD-10-CM | POA: Insufficient documentation

## 2024-09-14 LAB — HM MAMMOGRAPHY

## 2024-09-16 ENCOUNTER — Ambulatory Visit: Payer: Self-pay | Admitting: Obstetrics and Gynecology

## 2024-09-16 ENCOUNTER — Encounter: Payer: Self-pay | Admitting: Obstetrics and Gynecology

## 2024-10-14 ENCOUNTER — Other Ambulatory Visit: Payer: Self-pay

## 2024-10-14 ENCOUNTER — Telehealth (HOSPITAL_BASED_OUTPATIENT_CLINIC_OR_DEPARTMENT_OTHER): Payer: Self-pay

## 2024-10-14 ENCOUNTER — Ambulatory Visit: Admitting: Obstetrics and Gynecology

## 2024-10-14 ENCOUNTER — Encounter: Payer: Self-pay | Admitting: Obstetrics and Gynecology

## 2024-10-14 VITALS — BP 99/70 | HR 63 | Ht 60.0 in | Wt 108.4 lb

## 2024-10-14 DIAGNOSIS — Z1331 Encounter for screening for depression: Secondary | ICD-10-CM

## 2024-10-14 DIAGNOSIS — Z23 Encounter for immunization: Secondary | ICD-10-CM

## 2024-10-14 DIAGNOSIS — D229 Melanocytic nevi, unspecified: Secondary | ICD-10-CM | POA: Diagnosis not present

## 2024-10-14 DIAGNOSIS — Z79890 Hormone replacement therapy (postmenopausal): Secondary | ICD-10-CM | POA: Diagnosis not present

## 2024-10-14 DIAGNOSIS — M8580 Other specified disorders of bone density and structure, unspecified site: Secondary | ICD-10-CM | POA: Diagnosis not present

## 2024-10-14 DIAGNOSIS — Z01419 Encounter for gynecological examination (general) (routine) without abnormal findings: Secondary | ICD-10-CM | POA: Diagnosis not present

## 2024-10-14 MED ORDER — ESTRADIOL 0.5 MG PO TABS: 0.5000 mg | ORAL_TABLET | Freq: Every day | ORAL | 3 refills | Status: AC

## 2024-10-14 NOTE — Progress Notes (Signed)
 63 y.o. y.o. female here for annual exam. No LMP recorded. Patient has had a hysterectomy.     Ultrasound report today with left ovary 1.8 cm x 1.36 cm right ovary with 1.61 x 1.37 cm.  Right ovary with 8.1 mm follicle Uterus surgically removed Vaginal cuff within normal limits Ovaries atrophic in size simple residual follicle seen on right ovary no adnexal masses no free fluid   HPI: 2012 TVH for fibroids. Estradiol  0.5 mg daily PO with moderate  relief of hot flashes. May start weaning next year since 70yr use. Normal pap h/o.  Pap 07/2021 Neg.   Breasts normal. Mammo 08/2024 Neg. BD 06/2020 with Osteopenia Lt Femoral Neck -1.9, taking calcium and vitamin D  daily.2023 -1.5. Not sexually active. 2012 fracture from fall tripped on gum ball. She would be interested in starting prolia. Counseled on r/b/a/I and need to stay on it or change to something else if she decides to stop.  Body mass index is 20.14 kg/m. 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 Health Labs here today.  Will establish with a Fam MD for next year.  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  Body mass index is 21.17 kg/m.     10/14/2024    3:13 PM 03/05/2016    3:10 PM  Depression screen PHQ 2/9  Decreased Interest 0 0  Down, Depressed, Hopeless 0 0  PHQ - 2 Score 0 0    Blood pressure 99/70, pulse 63, height 5' (1.524 m), weight 108 lb 6.4 oz (49.2 kg), SpO2 96%.     Component Value Date/Time   DIAGPAP  07/16/2021 1155    - Negative for intraepithelial lesion or malignancy (NILM)   ADEQPAP Satisfactory for evaluation. 07/16/2021 1155    GYN HISTORY:    Component Value Date/Time   DIAGPAP  07/16/2021 1155    - Negative for intraepithelial lesion or malignancy (NILM)   ADEQPAP Satisfactory for evaluation. 07/16/2021 1155    OB History  Gravida Para Term Preterm AB Living  0 0 0 0 0 0  SAB IAB Ectopic Multiple Live Births  0 0 0 0 0    Past Medical History:  Diagnosis Date   Ankle  fracture 10/17/2011   RIGHT-DUE TO A FALL   COVID    8/24   Fibroid    Hiatal hernia     Past Surgical History:  Procedure Laterality Date   VAGINAL HYSTERECTOMY  [id number]   TOTAL LAPAROSCOPIC HYSTERECTOMY    Current Outpatient Medications on File Prior to Visit  Medication Sig Dispense Refill   calcium-vitamin D  (OSCAL WITH D) 500-200 MG-UNIT per tablet Take 1 tablet by mouth daily.       estradiol  (ESTRACE  VAGINAL) 0.1 MG/GM vaginal cream Place 1 Applicatorful vaginally at bedtime. Rub a pea size amount in the vagina 3 times a week 42.5 g 12   estradiol  (ESTRACE ) 0.5 MG tablet TAKE 1 TABLET BY MOUTH EVERY DAY 90 tablet 3   Multiple Vitamin (MULTIVITAMIN) tablet Take 1 tablet by mouth daily.       triamcinolone  cream (KENALOG ) 0.1 % 1 Application as needed.     No current facility-administered medications on file prior to visit.    Social History   Socioeconomic History   Marital status: Divorced    Spouse name: Not on file   Number of children: Not on file   Years of education: Not on file   Highest education level: Not on file  Occupational History   Not on file  Tobacco Use   Smoking status: Former   Smokeless tobacco: Never  Vaping Use   Vaping status: Never Used  Substance and Sexual Activity   Alcohol use: Not Currently   Drug use: No   Sexual activity: Not Currently    Partners: Male    Birth control/protection: Surgical, Abstinence    Comment: intercourse age unknown, 5 sexual partners, hysterectomy  Other Topics Concern   Not on file  Social History Narrative   Not on file   Social Drivers of Health   Financial Resource Strain: Not on file  Food Insecurity: Not on file  Transportation Needs: Not on file  Physical Activity: Not on file  Stress: Not on file  Social Connections: Not on file  Intimate Partner Violence: Not on file    Family History  Problem Relation Age of Onset   Diabetes Paternal Grandmother      No Known  Allergies    Patient's last menstrual period was No LMP recorded. Patient has had a hysterectomy..            Review of Systems Alls systems reviewed and are negative.     Physical Exam Constitutional:      Appearance: Normal appearance.  Genitourinary:     Vulva normal.     No lesions in the vagina.     Right Labia: No rash, lesions or skin changes.    Left Labia: No lesions, skin changes or rash.    Vaginal cuff intact.    No vaginal discharge or tenderness.     No vaginal prolapse present.    Moderate vaginal atrophy present.     Right Adnexa: no mass present.    Left Adnexa: no mass present.    Cervix is not absent.     Uterus is not absent. Breasts:    Right: Normal.     Left: Normal.  HENT:     Head: Normocephalic.  Neck:     Thyroid: No thyroid mass, thyromegaly or thyroid tenderness.  Cardiovascular:     Rate and Rhythm: Normal rate and regular rhythm.     Heart sounds: Normal heart sounds, S1 normal and S2 normal.  Pulmonary:     Effort: Pulmonary effort is normal.     Breath sounds: Normal breath sounds and air entry.  Abdominal:     General: Bowel sounds are normal. There is no distension.     Palpations: Abdomen is soft. There is no mass.     Tenderness: There is no abdominal tenderness. There is no guarding or rebound.  Musculoskeletal:     Cervical back: Full passive range of motion without pain, normal range of motion and neck supple. No tenderness.     Right lower leg: No edema.     Left lower leg: No edema.  Neurological:     Mental Status: She is alert.  Skin:    General: Skin is warm.  Psychiatric:        Mood and Affect: Mood normal.        Behavior: Behavior normal.        Thought Content: Thought content normal.  Vitals and nursing note reviewed. Exam conducted with a chaperone present.       A:         Well Woman GYN exam  P:        Pap smear not indicated Encouraged annual mammogram  screening Colon cancer screening up-to-date DXA ordered. Patient also with osteopenia and fall fracture. She would like to start prolia. To begin PA Labs and immunizations to do with PMD Discussed breast self exams Encouraged healthy lifestyle practices Encouraged Vit D and Calcium  Flus shot here today  No follow-ups on file.  Bethany Watts

## 2024-10-14 NOTE — Addendum Note (Signed)
 Addended by: GLENNON ALMARIE POUR on: 10/14/2024 04:46 PM   Modules accepted: Orders

## 2024-10-21 ENCOUNTER — Other Ambulatory Visit: Payer: Self-pay | Admitting: *Deleted

## 2024-10-21 DIAGNOSIS — M810 Age-related osteoporosis without current pathological fracture: Secondary | ICD-10-CM

## 2024-10-21 MED ORDER — DENOSUMAB 60 MG/ML ~~LOC~~ SOSY: 60.0000 mg | PREFILLED_SYRINGE | Freq: Once | SUBCUTANEOUS | 0 refills | Status: AC

## 2024-10-21 NOTE — Progress Notes (Signed)
 Order pended for Prolia #1, 0RF to [facility]. Routing to Dr. Glennon to review and sign prescription.

## 2024-10-26 ENCOUNTER — Other Ambulatory Visit: Payer: Self-pay

## 2024-12-01 ENCOUNTER — Telehealth: Payer: Self-pay | Admitting: *Deleted

## 2024-12-01 NOTE — Telephone Encounter (Signed)
 Spoke with Rosina at [facility]. Need updated PA to process for Prolia . PA forms signed by Dr. Glennon and faxed to [facility] at [phone].

## 2024-12-13 ENCOUNTER — Encounter: Payer: Self-pay | Admitting: Family

## 2024-12-13 ENCOUNTER — Ambulatory Visit: Payer: Self-pay | Admitting: Family

## 2024-12-13 VITALS — BP 110/54 | HR 71 | Temp 98.3°F | Ht 60.0 in | Wt 109.2 lb

## 2024-12-13 DIAGNOSIS — M8589 Other specified disorders of bone density and structure, multiple sites: Secondary | ICD-10-CM | POA: Diagnosis not present

## 2024-12-13 DIAGNOSIS — Z0000 Encounter for general adult medical examination without abnormal findings: Secondary | ICD-10-CM | POA: Diagnosis not present

## 2024-12-13 DIAGNOSIS — E782 Mixed hyperlipidemia: Secondary | ICD-10-CM | POA: Diagnosis not present

## 2024-12-13 LAB — COMPREHENSIVE METABOLIC PANEL WITH GFR
ALT: 17 U/L (ref 3–35)
AST: 22 U/L (ref 5–37)
Albumin: 4.6 g/dL (ref 3.5–5.2)
Alkaline Phosphatase: 63 U/L (ref 39–117)
BUN: 19 mg/dL (ref 6–23)
CO2: 32 meq/L (ref 19–32)
Calcium: 9.6 mg/dL (ref 8.4–10.5)
Chloride: 100 meq/L (ref 96–112)
Creatinine, Ser: 0.75 mg/dL (ref 0.40–1.20)
GFR: 85.38 mL/min
Glucose, Bld: 92 mg/dL (ref 70–99)
Potassium: 4.5 meq/L (ref 3.5–5.1)
Sodium: 140 meq/L (ref 135–145)
Total Bilirubin: 0.4 mg/dL (ref 0.2–1.2)
Total Protein: 7.4 g/dL (ref 6.0–8.3)

## 2024-12-13 LAB — CBC WITH DIFFERENTIAL/PLATELET
Basophils Absolute: 0 K/uL (ref 0.0–0.1)
Basophils Relative: 0.7 % (ref 0.0–3.0)
Eosinophils Absolute: 0.1 K/uL (ref 0.0–0.7)
Eosinophils Relative: 1.5 % (ref 0.0–5.0)
HCT: 42.6 % (ref 36.0–46.0)
Hemoglobin: 14.3 g/dL (ref 12.0–15.0)
Lymphocytes Relative: 27 % (ref 12.0–46.0)
Lymphs Abs: 1.2 K/uL (ref 0.7–4.0)
MCHC: 33.6 g/dL (ref 30.0–36.0)
MCV: 93.6 fl (ref 78.0–100.0)
Monocytes Absolute: 0.3 K/uL (ref 0.1–1.0)
Monocytes Relative: 7.6 % (ref 3.0–12.0)
Neutro Abs: 2.8 K/uL (ref 1.4–7.7)
Neutrophils Relative %: 63.2 % (ref 43.0–77.0)
Platelets: 224 K/uL (ref 150.0–400.0)
RBC: 4.55 Mil/uL (ref 3.87–5.11)
RDW: 13.5 % (ref 11.5–15.5)
WBC: 4.4 K/uL (ref 4.0–10.5)

## 2024-12-13 LAB — LIPID PANEL
Cholesterol: 206 mg/dL — ABNORMAL HIGH (ref 28–200)
HDL: 89.9 mg/dL
LDL Cholesterol: 92 mg/dL (ref 10–99)
NonHDL: 115.82
Total CHOL/HDL Ratio: 2
Triglycerides: 117 mg/dL (ref 10.0–149.0)
VLDL: 23.4 mg/dL (ref 0.0–40.0)

## 2024-12-13 LAB — TSH: TSH: 2.92 u[IU]/mL (ref 0.35–5.50)

## 2024-12-13 NOTE — Patient Instructions (Signed)
 Welcome to [facility], It was a pleasure meeting you today!  I will review your lab results via MyChart in a few days.  Happy new year!   PLEASE NOTE: If you had any LAB tests please let us  know if you have not heard back within a few days. You may see your results on MyChart before we have a chance to review them but we will give you a call once they are reviewed by us . If we ordered any REFERRALS today, please let us  know if you have not heard from their office within the next week.  Let us  know through MyChart if you are needing REFILLS, or have your pharmacy send us  the request. You can also use MyChart to communicate with me or any office staff.  Please try these tips to maintain a healthy lifestyle: It is important that you exercise regularly at least 30 minutes 5 times a week. Think about what you will eat, plan ahead. Choose whole foods, & think  clean, green, fresh or frozen over canned, processed or packaged foods which are more sugary, salty, and fatty. 70 to 75% of food eaten should be fresh vegetables and protein. 2-3  meals daily with healthy snacks between meals, but must be whole fruit, protein or vegetables. Aim to eat over a 10 hour period when you are active, for example, 7am to 5pm, and then STOP after your last meal of the day, drinking only water.  Shorter eating windows, 6-8 hours, are showing benefits in heart disease and blood sugar regulation. Drink water every day! Shoot for 64 ounces daily = 8 cups, no other drink is as healthy! Fruit juice is best enjoyed in a healthy way, by EATING the fruit.

## 2024-12-13 NOTE — Progress Notes (Signed)
 " Phone [phone]  Subjective:   Patient is a 63 y.o. female presenting for annual physical.    Chief Complaint  Patient presents with   New Patient (Initial Visit)   Annual Exam  Discussed the use of AI scribe software for clinical note transcription with the patient, who gave verbal consent to proceed.  History of Present Illness Bethany Watts is a 63 year old female who is establishing care and presents for an annual physical exam.  She has had a hysterectomy and does not need Pap smears. She gets annual screening mammograms and has no breast cancer. Her last colonoscopy was about 10 years ago with no significant findings other than a possible polyp, and the next is planned for 2028, she believes. Earlier this month she had an episode of diarrhea that lasted several days and resolved without treatment. She has no current diarrhea or constipation.  She is very physically active with yoga, walking, and martial arts. She does not drink alcohol. She is a former smoker with less than 10 years of use, quit long ago. She lives alone, is divorced, works as a lexicographer, and is up to date on her flu shot.   See problem oriented charting- ROS- full  review of systems was completed and negative except for what is noted in HPI above.  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   COVID    8/24   Fibroid    Hiatal hernia    Left Achilles tendinitis 07/16/2024   Pain in joint of right shoulder 06/14/2024   Patient Active Problem List   Diagnosis Date Noted   Colon cancer screening 03/06/2023   Lateral epicondylitis of right elbow 12/26/2022   Acute posttraumatic headache 12/26/2022   History of total vaginal hysterectomy (TVH) 06/14/2020   Osteopenia  of multiple sites 06/14/2020   Right elbow pain 01/03/2018   Back injury    Hiatal hernia    Anemia    Past Surgical History:  Procedure Laterality Date   VAGINAL HYSTERECTOMY  [id number]   TOTAL LAPAROSCOPIC HYSTERECTOMY    Family History  Problem Relation Age of Onset   Diabetes Paternal Grandmother     Medications- reviewed and updated Current Outpatient Medications  Medication Sig Dispense Refill   calcium-vitamin D  (OSCAL WITH D) 500-200 MG-UNIT per tablet Take 1 tablet by mouth daily.       estradiol  (ESTRACE  VAGINAL) 0.1 MG/GM vaginal cream Place 1 Applicatorful vaginally at bedtime. Rub a pea size amount in the vagina 3 times a week 42.5 g 12   estradiol  (ESTRACE ) 0.5 MG tablet Take 1 tablet (0.5 mg total) by mouth daily. 90 tablet 3   Multiple Vitamin (MULTIVITAMIN) tablet Take 1 tablet by mouth daily.       triamcinolone  cream (KENALOG ) 0.1 % 1 Application as needed.     No current facility-administered medications for this visit.    Allergies-reviewed and updated Allergies[1]  Social History   Social History Narrative   Not on file    Objective:  BP (!) 110/54 (BP Location: Left Arm, Patient Position: Sitting, Cuff Size: Normal)   Pulse 71   Temp 98.3 F (36.8 C) (Temporal)   Ht 5' (1.524 m)   Wt 109 lb 4 oz (49.6 kg)  SpO2 96%   BMI 21.34 kg/m  Physical Exam Vitals and nursing note reviewed.  Constitutional:      Appearance: Normal appearance.  HENT:     Head: Normocephalic.     Right Ear: Tympanic membrane normal.     Left Ear: Tympanic membrane normal.     Nose: Nose normal.     Mouth/Throat:     Mouth: Mucous membranes are moist.  Eyes:     Pupils: Pupils are equal, round, and reactive to light.  Cardiovascular:     Rate and Rhythm: Normal rate and regular rhythm.  Pulmonary:     Effort: Pulmonary effort is normal.     Breath sounds: Normal breath sounds.  Musculoskeletal:        General: Normal range of motion.     Cervical back: Normal  range of motion.  Lymphadenopathy:     Cervical: No cervical adenopathy.  Skin:    General: Skin is warm and dry.  Neurological:     Mental Status: She is alert.  Psychiatric:        Mood and Affect: Mood normal.        Behavior: Behavior normal.      Assessment and Plan   Health Maintenance counseling: 1. Anticipatory guidance: Patient counseled regarding regular dental exams q6 months, eye exams,  avoiding smoking and second hand smoke, limiting alcohol to 1 beverage per day, no illicit drugs.   2. Risk factor reduction:  Advised patient of need for regular exercise and diet rich with fruits and vegetables to reduce risk of heart attack and stroke.  Wt Readings from Last 3 Encounters:  12/13/24 109 lb 4 oz (49.6 kg)  10/14/24 108 lb 6.4 oz (49.2 kg)  10/09/23 104 lb (47.2 kg)   3. Immunizations/screenings/ancillary studies Immunization History  Administered Date(s) Administered   Influenza, Seasonal, Injecte, Preservative Fre 10/09/2023, 10/14/2024   Influenza,inj,quad, With Preservative 09/16/2019   Influenza-Unspecified 10/02/2017   PFIZER Comirnaty(Gray Top)Covid-19 Tri-Sucrose Vaccine 02/24/2020, 03/23/2020, 11/07/2020, 07/09/2021   There are no preventive care reminders to display for this patient.   4. Cervical cancer screening- hx of hysterectomy 5. Breast cancer screening-  mammogram done 2025 6. Colon cancer screening - due next year 7. Skin cancer screening- advised regular sunscreen use. Denies worrisome, changing, or new skin lesions.  8. Birth control/STD check- none/declined need 9. Osteoporosis screening-  utd - last done 2023 10. Alcohol screening: none  11. Smoking associated screening (lung cancer screening, AAA screen 65-75, UA)- non- smoker  12. Exercise-  martial arts, walking, yoga Assessment & Plan Adult Wellness Visit Routine wellness visit with no acute concerns. Engaged in regular exercise and healthy lifestyle. Discussed exercise benefits for  cancer and Alzheimer's prevention. Received flu shot. - Continue regular exercise regimen. - Maintain healthy diet with emphasis on vegetables, protein, and fish. - Ensure adequate hydration with 2-2.5 liters of water daily. - Use sunscreen regularly and maintain dermatology follow-up every two years. - Consider COVID-19 vaccination if desired, though low risk for severe infection.  Mixed hyperlipidemia No changes in management during this visit. Last lipid panel with slight elevations in TC & LDL. - Recheck lipid panel today. - Discussed eating a low saturated fat diet, continue daily cardio exercise. - F/U in 1 year  Osteopenia of multiple sites No changes in management during this visit. Last DEXA scan in 2023. Followed by GYN.   Recommended follow up:  No follow-ups on file. Future Appointments  Date Time Provider Department Center  05/23/2025 11:30 AM Sandridge, Erminio POUR, PA-C [facility] None    Lab/Order associations:  fasting   Lucius Krabbe, NP      [1] No Known Allergies  "

## 2024-12-15 ENCOUNTER — Ambulatory Visit: Payer: Self-pay | Admitting: Family

## 2024-12-23 ENCOUNTER — Other Ambulatory Visit: Payer: Self-pay | Admitting: *Deleted

## 2024-12-23 ENCOUNTER — Encounter: Payer: Self-pay | Admitting: *Deleted

## 2024-12-23 MED ORDER — DENOSUMAB 60 MG/ML ~~LOC~~ SOSY: 60.0000 mg | PREFILLED_SYRINGE | Freq: Once | SUBCUTANEOUS | Status: AC

## 2025-05-23 ENCOUNTER — Ambulatory Visit: Admitting: Physician Assistant

## 2025-12-19 ENCOUNTER — Encounter: Admitting: Family
